# Patient Record
Sex: Female | Born: 1935 | Race: White | Hispanic: No | Smoking: Never smoker
Health system: Southern US, Community
[De-identification: ages and names within clinical notes are randomized; demographics above are authoritative.]

## PROBLEM LIST (undated history)

## (undated) DIAGNOSIS — I10 Essential (primary) hypertension: Secondary | ICD-10-CM

## (undated) DIAGNOSIS — I6350 Cerebral infarction due to unspecified occlusion or stenosis of unspecified cerebral artery: Secondary | ICD-10-CM

## (undated) DIAGNOSIS — E785 Hyperlipidemia, unspecified: Secondary | ICD-10-CM

## (undated) DIAGNOSIS — E119 Type 2 diabetes mellitus without complications: Secondary | ICD-10-CM

## (undated) DIAGNOSIS — F411 Generalized anxiety disorder: Secondary | ICD-10-CM

## (undated) DIAGNOSIS — I499 Cardiac arrhythmia, unspecified: Secondary | ICD-10-CM

## (undated) DIAGNOSIS — R002 Palpitations: Secondary | ICD-10-CM

## (undated) HISTORY — PX: BREAST ENHANCEMENT SURGERY: SHX7

## (undated) HISTORY — DX: Type 2 diabetes mellitus without complications: E11.9

## (undated) HISTORY — PX: COLONOSCOPY: SHX174

## (undated) HISTORY — DX: Palpitations: R00.2

## (undated) HISTORY — PX: WISDOM TOOTH EXTRACTION: SHX21

## (undated) HISTORY — DX: Cardiac arrhythmia, unspecified: I49.9

## (undated) HISTORY — DX: Generalized anxiety disorder: F41.1

## (undated) HISTORY — DX: Cerebral infarction due to unspecified occlusion or stenosis of unspecified cerebral artery: I63.50

## (undated) HISTORY — DX: Essential (primary) hypertension: I10

## (undated) HISTORY — PX: HEMORRHOID SURGERY: SHX153

## (undated) HISTORY — DX: Hyperlipidemia, unspecified: E78.5

---

## 1972-07-11 HISTORY — PX: ABDOMINAL HYSTERECTOMY: SHX81

## 1979-07-12 HISTORY — PX: BACK SURGERY: SHX140

## 1983-07-12 HISTORY — PX: NASAL SINUS SURGERY: SHX719

## 2000-02-29 ENCOUNTER — Other Ambulatory Visit: Admission: RE | Admit: 2000-02-29 | Discharge: 2000-02-29 | Payer: Self-pay | Admitting: Obstetrics and Gynecology

## 2000-05-04 ENCOUNTER — Encounter: Payer: Self-pay | Admitting: Obstetrics and Gynecology

## 2000-05-04 ENCOUNTER — Encounter: Admission: RE | Admit: 2000-05-04 | Discharge: 2000-05-04 | Payer: Self-pay | Admitting: Obstetrics and Gynecology

## 2000-12-05 ENCOUNTER — Ambulatory Visit (HOSPITAL_COMMUNITY): Admission: RE | Admit: 2000-12-05 | Discharge: 2000-12-05 | Payer: Self-pay | Admitting: Ophthalmology

## 2001-07-17 ENCOUNTER — Other Ambulatory Visit: Admission: RE | Admit: 2001-07-17 | Discharge: 2001-07-17 | Payer: Self-pay | Admitting: Obstetrics and Gynecology

## 2001-09-12 ENCOUNTER — Ambulatory Visit (HOSPITAL_COMMUNITY): Admission: RE | Admit: 2001-09-12 | Discharge: 2001-09-12 | Payer: Self-pay | Admitting: Family Medicine

## 2001-09-12 ENCOUNTER — Encounter: Payer: Self-pay | Admitting: Family Medicine

## 2001-09-24 ENCOUNTER — Encounter: Payer: Self-pay | Admitting: Internal Medicine

## 2001-09-24 ENCOUNTER — Ambulatory Visit (HOSPITAL_COMMUNITY): Admission: RE | Admit: 2001-09-24 | Discharge: 2001-09-24 | Payer: Self-pay | Admitting: Internal Medicine

## 2002-01-02 ENCOUNTER — Encounter: Payer: Self-pay | Admitting: Obstetrics and Gynecology

## 2002-01-02 ENCOUNTER — Encounter: Admission: RE | Admit: 2002-01-02 | Discharge: 2002-01-02 | Payer: Self-pay | Admitting: Obstetrics and Gynecology

## 2002-09-23 ENCOUNTER — Other Ambulatory Visit: Admission: RE | Admit: 2002-09-23 | Discharge: 2002-09-23 | Payer: Self-pay | Admitting: Obstetrics and Gynecology

## 2003-03-25 ENCOUNTER — Encounter: Admission: RE | Admit: 2003-03-25 | Discharge: 2003-03-25 | Payer: Self-pay | Admitting: Obstetrics and Gynecology

## 2003-03-25 ENCOUNTER — Encounter: Payer: Self-pay | Admitting: Obstetrics and Gynecology

## 2004-05-10 ENCOUNTER — Ambulatory Visit (HOSPITAL_COMMUNITY): Admission: RE | Admit: 2004-05-10 | Discharge: 2004-05-10 | Payer: Self-pay | Admitting: Family Medicine

## 2004-06-15 ENCOUNTER — Encounter: Admission: RE | Admit: 2004-06-15 | Discharge: 2004-06-15 | Payer: Self-pay | Admitting: Obstetrics and Gynecology

## 2005-01-24 ENCOUNTER — Encounter: Admission: RE | Admit: 2005-01-24 | Discharge: 2005-01-24 | Payer: Self-pay | Admitting: Obstetrics and Gynecology

## 2005-08-15 ENCOUNTER — Encounter: Admission: RE | Admit: 2005-08-15 | Discharge: 2005-08-15 | Payer: Self-pay | Admitting: Obstetrics and Gynecology

## 2006-02-14 ENCOUNTER — Emergency Department (HOSPITAL_COMMUNITY): Admission: EM | Admit: 2006-02-14 | Discharge: 2006-02-15 | Payer: Self-pay | Admitting: Emergency Medicine

## 2006-07-11 DIAGNOSIS — I6350 Cerebral infarction due to unspecified occlusion or stenosis of unspecified cerebral artery: Secondary | ICD-10-CM

## 2006-07-11 HISTORY — DX: Cerebral infarction due to unspecified occlusion or stenosis of unspecified cerebral artery: I63.50

## 2006-07-17 ENCOUNTER — Inpatient Hospital Stay (HOSPITAL_COMMUNITY): Admission: EM | Admit: 2006-07-17 | Discharge: 2006-07-19 | Payer: Self-pay | Admitting: Emergency Medicine

## 2006-10-12 ENCOUNTER — Encounter: Admission: RE | Admit: 2006-10-12 | Discharge: 2006-10-12 | Payer: Self-pay | Admitting: Obstetrics and Gynecology

## 2007-09-15 ENCOUNTER — Encounter: Payer: Self-pay | Admitting: Internal Medicine

## 2007-09-20 ENCOUNTER — Ambulatory Visit (HOSPITAL_COMMUNITY): Admission: RE | Admit: 2007-09-20 | Discharge: 2007-09-20 | Payer: Self-pay | Admitting: Family Medicine

## 2007-11-05 ENCOUNTER — Encounter: Admission: RE | Admit: 2007-11-05 | Discharge: 2007-11-05 | Payer: Self-pay | Admitting: Obstetrics and Gynecology

## 2008-02-14 ENCOUNTER — Ambulatory Visit (HOSPITAL_COMMUNITY): Admission: RE | Admit: 2008-02-14 | Discharge: 2008-02-14 | Payer: Self-pay | Admitting: Family Medicine

## 2008-05-13 ENCOUNTER — Encounter: Payer: Self-pay | Admitting: Internal Medicine

## 2008-06-02 ENCOUNTER — Encounter: Payer: Self-pay | Admitting: Internal Medicine

## 2008-09-05 ENCOUNTER — Encounter: Payer: Self-pay | Admitting: Internal Medicine

## 2008-11-07 ENCOUNTER — Ambulatory Visit: Payer: Self-pay | Admitting: Internal Medicine

## 2008-11-07 ENCOUNTER — Encounter (INDEPENDENT_AMBULATORY_CARE_PROVIDER_SITE_OTHER): Payer: Self-pay | Admitting: *Deleted

## 2008-11-17 ENCOUNTER — Telehealth: Payer: Self-pay | Admitting: Internal Medicine

## 2008-11-19 ENCOUNTER — Ambulatory Visit: Payer: Self-pay | Admitting: Internal Medicine

## 2008-11-19 LAB — HM COLONOSCOPY: HM Colonoscopy: NORMAL

## 2008-11-28 ENCOUNTER — Encounter: Admission: RE | Admit: 2008-11-28 | Discharge: 2008-11-28 | Payer: Self-pay | Admitting: Obstetrics and Gynecology

## 2009-02-04 ENCOUNTER — Encounter: Payer: Self-pay | Admitting: Internal Medicine

## 2009-06-26 ENCOUNTER — Ambulatory Visit (HOSPITAL_COMMUNITY): Admission: RE | Admit: 2009-06-26 | Discharge: 2009-06-26 | Payer: Self-pay | Admitting: Family Medicine

## 2009-07-01 ENCOUNTER — Encounter: Payer: Self-pay | Admitting: Internal Medicine

## 2009-07-01 ENCOUNTER — Ambulatory Visit (HOSPITAL_COMMUNITY): Admission: RE | Admit: 2009-07-01 | Discharge: 2009-07-01 | Payer: Self-pay | Admitting: Family Medicine

## 2009-08-04 ENCOUNTER — Ambulatory Visit: Payer: Self-pay | Admitting: Internal Medicine

## 2009-08-04 DIAGNOSIS — I6350 Cerebral infarction due to unspecified occlusion or stenosis of unspecified cerebral artery: Secondary | ICD-10-CM | POA: Insufficient documentation

## 2009-08-04 DIAGNOSIS — R002 Palpitations: Secondary | ICD-10-CM | POA: Insufficient documentation

## 2009-08-04 DIAGNOSIS — R32 Unspecified urinary incontinence: Secondary | ICD-10-CM | POA: Insufficient documentation

## 2009-08-04 DIAGNOSIS — I10 Essential (primary) hypertension: Secondary | ICD-10-CM | POA: Insufficient documentation

## 2009-08-04 DIAGNOSIS — K5730 Diverticulosis of large intestine without perforation or abscess without bleeding: Secondary | ICD-10-CM | POA: Insufficient documentation

## 2009-08-04 DIAGNOSIS — Z9189 Other specified personal risk factors, not elsewhere classified: Secondary | ICD-10-CM | POA: Insufficient documentation

## 2009-08-04 DIAGNOSIS — Z87448 Personal history of other diseases of urinary system: Secondary | ICD-10-CM | POA: Insufficient documentation

## 2009-08-04 DIAGNOSIS — I499 Cardiac arrhythmia, unspecified: Secondary | ICD-10-CM | POA: Insufficient documentation

## 2009-08-10 ENCOUNTER — Telehealth: Payer: Self-pay | Admitting: Internal Medicine

## 2009-08-10 ENCOUNTER — Ambulatory Visit: Payer: Self-pay | Admitting: Internal Medicine

## 2009-08-10 DIAGNOSIS — F411 Generalized anxiety disorder: Secondary | ICD-10-CM | POA: Insufficient documentation

## 2009-08-24 ENCOUNTER — Ambulatory Visit: Payer: Self-pay | Admitting: Internal Medicine

## 2009-09-08 ENCOUNTER — Ambulatory Visit: Payer: Self-pay | Admitting: Internal Medicine

## 2009-09-08 DIAGNOSIS — E785 Hyperlipidemia, unspecified: Secondary | ICD-10-CM | POA: Insufficient documentation

## 2009-10-02 ENCOUNTER — Ambulatory Visit: Payer: Self-pay | Admitting: Internal Medicine

## 2009-10-02 LAB — CONVERTED CEMR LAB
ALT: 18 units/L (ref 0–35)
AST: 18 units/L (ref 0–37)
Albumin: 3.8 g/dL (ref 3.5–5.2)
HDL: 41.6 mg/dL (ref >39.00)
Total Bilirubin: 0.6 mg/dL (ref 0.3–1.2)
Total Protein: 6.6 g/dL (ref 6.0–8.3)
Triglycerides: 80 mg/dL (ref 0.0–149.0)

## 2009-11-06 ENCOUNTER — Ambulatory Visit: Payer: Self-pay | Admitting: Internal Medicine

## 2009-11-06 LAB — CONVERTED CEMR LAB: Blood Glucose, Fingerstick: 145

## 2010-03-14 ENCOUNTER — Emergency Department (HOSPITAL_COMMUNITY): Admission: EM | Admit: 2010-03-14 | Discharge: 2010-03-14 | Payer: Self-pay | Admitting: Emergency Medicine

## 2010-03-26 ENCOUNTER — Telehealth: Payer: Self-pay | Admitting: Internal Medicine

## 2010-04-09 ENCOUNTER — Ambulatory Visit: Payer: Self-pay | Admitting: Internal Medicine

## 2010-04-09 ENCOUNTER — Encounter: Admission: RE | Admit: 2010-04-09 | Discharge: 2010-04-09 | Payer: Self-pay | Admitting: Internal Medicine

## 2010-04-09 ENCOUNTER — Encounter: Payer: Self-pay | Admitting: Internal Medicine

## 2010-04-09 DIAGNOSIS — R5381 Other malaise: Secondary | ICD-10-CM | POA: Insufficient documentation

## 2010-04-09 DIAGNOSIS — R5383 Other fatigue: Secondary | ICD-10-CM

## 2010-04-09 LAB — CONVERTED CEMR LAB
AST: 24 units/L (ref 0–37)
Albumin: 4.1 g/dL (ref 3.5–5.2)
Alkaline Phosphatase: 97 units/L (ref 39–117)
BUN: 18 mg/dL (ref 6–23)
Basophils Absolute: 0 10*3/uL (ref 0.0–0.1)
Bilirubin Urine: NEGATIVE
Blood in Urine, dipstick: NEGATIVE
Eosinophils Relative: 1.6 % (ref 0.0–5.0)
Glucose, Urine, Semiquant: NEGATIVE
HDL: 39.1 mg/dL (ref >39.00)
Hemoglobin: 13.2 g/dL (ref 12.0–15.0)
Lymphs Abs: 1.7 10*3/uL (ref 0.7–4.0)
MCHC: 34.3 g/dL (ref 30.0–36.0)
Monocytes Relative: 8.8 % (ref 3.0–12.0)
Neutro Abs: 4 10*3/uL (ref 1.4–7.7)
Neutrophils Relative %: 62.5 % (ref 43.0–77.0)
Platelets: 232 10*3/uL (ref 150.0–400.0)
Potassium: 4.6 meq/L (ref 3.5–5.1)
RBC: 4.21 M/uL (ref 3.87–5.11)
RDW: 13 % (ref 11.5–14.6)
Specific Gravity, Urine: <1.005
TSH: 2.51 microintl units/mL (ref 0.35–5.50)
Total CHOL/HDL Ratio: 4
Total Protein: 6.6 g/dL (ref 6.0–8.3)
Triglycerides: 100 mg/dL (ref 0.0–149.0)
VLDL: 20 mg/dL (ref 0.0–40.0)

## 2010-04-09 LAB — HM MAMMOGRAPHY: HM Mammogram: NEGATIVE

## 2010-04-21 ENCOUNTER — Ambulatory Visit: Payer: Self-pay | Admitting: Internal Medicine

## 2010-04-21 DIAGNOSIS — J209 Acute bronchitis, unspecified: Secondary | ICD-10-CM | POA: Insufficient documentation

## 2010-05-07 ENCOUNTER — Encounter: Payer: Self-pay | Admitting: Internal Medicine

## 2010-05-19 ENCOUNTER — Ambulatory Visit: Payer: Self-pay | Admitting: Internal Medicine

## 2010-05-19 DIAGNOSIS — M545 Low back pain, unspecified: Secondary | ICD-10-CM | POA: Insufficient documentation

## 2010-05-19 DIAGNOSIS — R109 Unspecified abdominal pain: Secondary | ICD-10-CM | POA: Insufficient documentation

## 2010-05-19 LAB — CONVERTED CEMR LAB
ALT: 18 units/L (ref 0–35)
AST: 16 units/L (ref 0–37)
Alkaline Phosphatase: 114 units/L (ref 39–117)
Basophils Relative: 0.4 % (ref 0.0–3.0)
Bilirubin Urine: NEGATIVE
Bilirubin, Direct: 0.1 mg/dL (ref 0.0–0.3)
Eosinophils Absolute: 0.1 10*3/uL (ref 0.0–0.7)
Hemoglobin, Urine: NEGATIVE
Leukocytes, UA: NEGATIVE
MCHC: 34.1 g/dL (ref 30.0–36.0)
MCV: 91 fL (ref 78.0–100.0)
Monocytes Absolute: 0.8 10*3/uL (ref 0.1–1.0)
Neutro Abs: 5.8 10*3/uL (ref 1.4–7.7)
Nitrite: NEGATIVE
Platelets: 236 10*3/uL (ref 150.0–400.0)
RBC: 4.21 M/uL (ref 3.87–5.11)
Specific Gravity, Urine: 1.01 (ref 1.000–1.030)
Total Bilirubin: 0.6 mg/dL (ref 0.3–1.2)
Urobilinogen, UA: 0.2 (ref 0.0–1.0)
pH: 7.5 (ref 5.0–8.0)

## 2010-06-14 ENCOUNTER — Telehealth (INDEPENDENT_AMBULATORY_CARE_PROVIDER_SITE_OTHER): Payer: Self-pay | Admitting: *Deleted

## 2010-08-01 ENCOUNTER — Encounter: Payer: Self-pay | Admitting: Obstetrics and Gynecology

## 2010-08-08 LAB — CONVERTED CEMR LAB
ALT: 15 units/L
CO2: 30 meq/L
Glucose, Bld: 96 mg/dL
HDL: 42 mg/dL
Hemoglobin: 13.1 g/dL
LDL Cholesterol: 75 mg/dL
Lymphocytes, automated: 31 %
MCV: 91.9 fL
Neutrophils Relative %: 59 %
Pap Smear: NEGATIVE
Platelets: 253 10*3/uL
Potassium: 4.4 meq/L
RBC: 4.45 M/uL
Sodium: 140 meq/L
TSH: 2.829 microintl units/mL
Total Bilirubin: 0.4 mg/dL
Triglyceride fasting, serum: 107 mg/dL

## 2010-08-10 NOTE — Assessment & Plan Note (Signed)
Summary: f/u appt/cd   Vital Signs:  Patient profile:   76 year old female Height:      66 inches (167.64 cm) Weight:      139.4 pounds (63.36 kg) BMI:     22.58 O2 Sat:      97 % on Room air Temp:     97.0 degrees F (36.11 degrees C) oral Pulse rate:   81 / minute BP sitting:   120 / 58  (left arm) Cuff size:   regular  Vitals Entered By: Orlan Leavens RMA (April 09, 2010 8:37 AM)  O2 Flow:  Room air CC: follow-up visit Is Patient Diabetic? No Pain Assessment Patient in pain? no        Primary Care Provider:  Newt Lukes MD  CC:  follow-up visit.  History of Present Illness: Patient feels well today, fasting for labs. Here for wellness Diet: Heart Healthy Physical Activities: mod active Depression/mood screen: Negative Hearing: Intact bilateral Visual Acuity: Grossly normal ADL's: Capable  Fall Risk: None Home Safety: Good Congnition eval: intact to orientation, naming, recall and repetition End-of-Life Planning: Advance directive - Full code/I agree     also review chronic med issues -  HTN - no longer taking Bbloc since seeing cards -  feels well controlled on azor, reports 100% med compliance - still feels fatigued but no CP or HA or edema or vision changes  palpitations -  saw cards for same in early 08/2009 - no longer taking Bbloc and does not feel this is making any difference in episodes -  episodes still occur 1-2x/week but not in any particular pattern or trigger  anxiety - uses xanax as needed - sleeping ok- has resumed exercise which helps reduce stress - primary concern today is FH of DM and wants her sugar checked  dylipidemia - labs reviewed from march 2011 - no problems with current meds - ?how to make HDL higher and if fish oil ok?-  very concerned re: possible  adv se and wants to check labs every 3 months to monitor  Preventive Screening-Counseling & Management  Alcohol-Tobacco     Alcohol drinks/day: 0     Alcohol Counseling:  not indicated; patient does not drink     Smoking Status: never  Caffeine-Diet-Exercise     Caffeine use/day: 1-2     Caffeine Counseling: not indicated; caffeine use is not excessive or problematic     Diet Counseling: not indicated; diet is assessed to be healthy     Does Patient Exercise: yes     Times/week: 3     Exercise Counseling: to improve exercise regimen     Depression Counseling: not indicated; screening negative for depression  Safety-Violence-Falls     Fall Risk Counseling: not indicated; no significant falls noted  Clinical Review Panels:  Prevention   Last Pap Smear:  Interpretation/Result:Negative for intraepithelial Lesion or Malignancy.    (12/09/2008)   Last Colonoscopy:  Location:  [facility].  (11/19/2008)  Immunizations   Last Flu Vaccine:  Fluvax 3+ (04/09/2010)   Last Pneumovax:  Historical (07/12/2007)  Lipid Management   Cholesterol:  152 (10/02/2009)   LDL (bad choesterol):  94 (10/02/2009)   HDL (good cholesterol):  41.60 (10/02/2009)   Triglycerides:  107 (06/04/2009)  CBC   WBC:  5.7 (06/04/2009)   RBC:  4.45 (06/04/2009)   Hgb:  13.1 (06/04/2009)   Hct:  40.9 (06/04/2009)   Platelets:  253 (06/04/2009)   MCV  91.9 (06/04/2009)  RDW  12.4 (06/04/2009)   PMN:  59 (06/04/2009)   Monos:  9 (06/04/2009)   Eosinophils:  1 (06/04/2009)   Basophil:  1 (06/04/2009)  Complete Metabolic Panel   Glucose:  96 (06/04/2009)   Sodium:  140 (06/04/2009)   Potassium:  4.4 (06/04/2009)   Chloride:  103 (06/04/2009)   CO2:  30 (06/04/2009)   BUN:  18 (06/04/2009)   Creatinine:  0.55 (06/04/2009)   Albumin:  3.8 (10/02/2009)   Total Protein:  6.6 (10/02/2009)   Calcium:  9.6 (06/04/2009)   Total Bili:  0.6 (10/02/2009)   Alk Phos:  80 (10/02/2009)   SGPT (ALT):  18 (10/02/2009)   SGOT (AST):  18 (10/02/2009)   Current Medications (verified): 1)  Azor 5-40 Mg Tabs (Amlodipine-Olmesartan) .[name].. 1 Tablet By Mouth Once Daily 2)   Aspirin 81 Mg  Tabs (Aspirin) .[name].. 1 Tablet By Mouth Once Daily 3)  Vitamin C 500 Mg Tabs (Ascorbic Acid) .... Take 2 By Mouth Qd 4)  Citracal Plus D Tabs (Multiple Minerals-Vitamins) .... Take 1 Three Times A Day 5)  Simvastatin 10 Mg Tabs (Simvastatin) .[name].. 1 By Mouth Once Daily 6)  Plavix 75 Mg Tabs (Clopidogrel Bisulfate) .[name].. 1 By Mouth Once Daily 7)  Fish Oil 1000 Mg Caps (Omega-3 Fatty Acids) .[name].. 1 By Mouth Once Daily  Allergies (verified): 1)  ! Penicillin 2)  ! Terramycin  Past History:  Past medical, surgical, family and social histories (including risk factors) reviewed, and no changes noted (except as noted below).  Past Medical History: Right Thalamic Infarction 07/2006 Hypertension dyslipidemia Diverticulosis anxiety  MD roster - gyn - richardson GI- gessner cards - taylor  Past Surgical History: Reviewed history from 08/04/2009 and no changes required. Hemorrhoidectomy (2, 1961 & 1964) Back Surgery (4-5 Lumbar inner body fusion) Hysterectomy (8119) Cholecystectomy (1981)  Family History: Reviewed history from 08/04/2009 and no changes required. Family History of Colon Cancer: Sister  Family History of Diabetes: Nephew Family History of Heart Disease: Brother Family History Lung cancer (brother, sister) Family History of Arthritis (parent, other relative) Stroke (parent, other relative)  mom expired age 25 yo!!  Social History: Reviewed history from 08/04/2009 and no changes required. Divorced, lives alone Retired Patient has never smoked.  Alcohol Use - no Daily Caffeine Use Illicit Drug Use - no  Review of Systems  The patient denies fever, weight loss, chest pain, syncope, and headaches.         fatigue (as above); otherwise, see HPI above. I have reviewed all other systems and they were negative.   Physical Exam  General:  alert, well-developed, well-nourished, and cooperative to examination.    Head:  Normocephalic and atraumatic  without obvious abnormalities. No apparent alopecia or balding. Eyes:  vision grossly intact; pupils equal, round and reactive to light.  conjunctiva and lids normal.    Ears:  normal pinnae bilaterally, without erythema, swelling, or tenderness to palpation. TMs clear, without effusion, or cerumen impaction. Hearing grossly normal bilaterally  Mouth:  teeth and gums in good repair; mucous membranes moist, without lesions or ulcers. oropharynx clear without exudate, no erythema.  Neck:  supple, full ROM, no masses, no thyromegaly; no thyroid nodules or tenderness. no JVD or carotid bruits.   Lungs:  normal respiratory effort, no intercostal retractions or use of accessory muscles; normal breath sounds bilaterally - no crackles and no wheezes.    Heart:  normal rate, regular rhythm, soft 2/6 syst murmur, and no rub. BLE without edema.  Abdomen:  soft, non-tender, normal bowel sounds, no distention; no masses and no appreciable hepatomegaly or splenomegaly.   Genitalia:  defer to gyn Msk:  No deformity or scoliosis noted of thoracic or lumbar spine.   Neurologic:  alert & oriented X3 and cranial nerves II-XII symetrically intact.  strength normal in all extremities, sensation intact to light touch, and gait normal. speech fluent without dysarthria or aphasia; follows commands with good comprehension.  Skin:  no rashes, vesicles, ulcers, or erythema. No nodules or irregularity to palpation.  Psych:  Oriented X3, memory intact for recent and remote, normally interactive, good eye contact, minimally anxious appearing, not depressed appearing, and not agitated.      Impression & Recommendations:  Problem # 1:  PREVENTIVE HEALTH CARE (ICD-V70.0)  Patient has been counseled on age-appropriate routine health concerns for screening and prevention. These are reviewed and up-to-date. Immunizations are up-to-date or declined. Labs and ECG ordered/reviewed.  Risk factors for depression per USPSTF are reviewed  and negative; Patient hearing function, visual acuity, and cognition is screened today; ADLs are reviewed and addressed as needed; functional ability and level of safety have been reviewed and are appropriate. Education, counseling, and referrals are performed as needed   Orders: TLB-Lipid Panel (80061-LIPID) TLB-BMP (Basic Metabolic Panel-BMET) (80048-METABOL) TLB-CBC Platelet - w/Differential (85025-CBCD) TLB-Hepatic/Liver Function Pnl (80076-HEPATIC) TLB-TSH (Thyroid Stimulating Hormone) (04540-JWJ) Medicare -1st Annual Wellness Visit [phone]) EKG w/ Interpretation (93000)  Problem # 2:  DYSLIPIDEMIA (ICD-272.4)  Her updated medication list for this problem includes:    Simvastatin 10 Mg Tabs (Simvastatin) .[name]... 1 by mouth once daily  check FLP and LFTs every 3 months at pt preference if labs normal, pt wishes to reduce simva by 1/2 (rx'd for CVA hx, never "bad chol" per pt)  Orders: TLB-Lipid Panel (80061-LIPID)  Problem # 3:  HYPERTENSION (ICD-401.9)  Her updated medication list for this problem includes:    Azor 5-40 Mg Tabs (Amlodipine-olmesartan) .[name]... 1 tablet by mouth once daily  BP today: 120/58 Prior BP: 124/68 (11/06/2009)  Labs Reviewed: K+: 4.4 (06/04/2009) Creat: : 0.55 (06/04/2009)   Chol: 152 (10/02/2009)   HDL: 41.60 (10/02/2009)   LDL: 94 (10/02/2009)   TG: 80.0 (10/02/2009)  Orders: EKG w/ Interpretation (93000)  Problem # 4:  ANXIETY STATE, UNSPECIFIED (ICD-300.00)  Problem # 5:  FATIGUE (ICD-780.79)  exam unchanged, follow labs  Orders: TLB-CBC Platelet - w/Differential (85025-CBCD) TLB-Hepatic/Liver Function Pnl (80076-HEPATIC) TLB-TSH (Thyroid Stimulating Hormone) (84443-TSH)  Complete Medication List: 1)  Azor 5-40 Mg Tabs (Amlodipine-olmesartan) .[name].. 1 tablet by mouth once daily 2)  Aspirin 81 Mg Tabs (Aspirin) .[name].. 1 tablet by mouth once daily 3)  Vitamin C 500 Mg Tabs (Ascorbic acid) .... Take 2 by mouth qd 4)  Citracal Plus D Tabs  (multiple Minerals-vitamins)  .... Take 1 three times a day 5)  Simvastatin 10 Mg Tabs (Simvastatin) .[name].. 1 by mouth once daily 6)  Plavix 75 Mg Tabs (Clopidogrel bisulfate) .[name].. 1 by mouth once daily 7)  Fish Oil 1000 Mg Caps (Omega-3 fatty acids) .[name].. 1 by mouth once daily  Other Orders: UA Dipstick w/o Micro (manual) (82956) Flu Vaccine 31yrs + MEDICARE PATIENTS (O1308) Administration Flu vaccine - MCR (M5784)  Patient Instructions: 1)  it was good to see you today.  2)  medications and labs reviewed today - no changes recommended today - copy of recommended screening provided 3)  test(s) ordered today - your results will be mailed to you after review in 48-72 hours from  the time of test completion; if any changes need to be made or there are abnormal results, you will be contacted directly.  4)  will consider reduction of simvastatin to 5mg  at bedtime if cholesterol well controlled 5)  Please schedule a follow-up appointment in 3 months to monitor cholesterol and liver tests, call sooner if problems.    Laboratory Results   Urine Tests    Routine Urinalysis   Color: yellow Appearance: Clear Glucose: negative   (Normal Range: Negative) Bilirubin: negative   (Normal Range: Negative) Ketone: negative   (Normal Range: Negative) Spec. Gravity: <1.005   (Normal Range: 1.003-1.035) Blood: negative   (Normal Range: Negative) pH: 5.0   (Normal Range: 5.0-8.0) Protein: negative   (Normal Range: Negative) Urobilinogen: 0.2   (Normal Range: 0-1) Nitrite: negative   (Normal Range: Negative) Leukocyte Esterace: negative   (Normal Range: Negative)      Flu Vaccine Consent Questions     Do you have a history of severe allergic reactions to this vaccine? no    Any prior history of allergic reactions to egg and/or gelatin? no    Do you have a sensitivity to the preservative Thimersol? no    Do you have a past history of Guillan-Barre Syndrome? no    Do you currently have an acute  febrile illness? no    Have you ever had a severe reaction to latex? no    Vaccine information given and explained to patient? yes    Are you currently pregnant? no    Lot Number:[id number]   Exp Date:01/08/2011   Site Given  per pt (R) gluterus IMsterace: negative   (Normal Range: Negative)      .lbmedflu

## 2010-08-10 NOTE — Assessment & Plan Note (Signed)
Summary: post er/fall/body pain/lb   Vital Signs:  Patient profile:   76 year old female Height:      66 inches (167.64 cm) Weight:      141.8 pounds (64.45 kg) O2 Sat:      99 % on Room air Temp:     98.3 degrees F (36.83 degrees C) oral Pulse rate:   93 / minute BP sitting:   132 / 62  (left arm) Cuff size:   regular  Vitals Entered By: Orlan Leavens RMA (May 19, 2010 10:58 AM)  O2 Flow:  Room air CC: Post ER Is Patient Diabetic? No Pain Assessment Patient in pain? yes     Location: body Type: aching Comments Pt fell on 05/10/10 went to ED in [place]   Primary Care Provider:  Newt Lukes MD  CC:  Post ER.  History of Present Illness: ER f/u s/p accidental fall in [place], [place] 05/07/10 trauma to right shoulder and posterior scalp (no ICH on ct head) no ha but cont right flank pain and right lower rib - concerned about internal injury or rib fx or spine fx - no hematuria, no n/v or bm changes no sob, no cough - no difficulty walking - pain improved with tylenol each am but sore by evening -  Current Medications (verified): 1)  Azor 5-40 Mg Tabs (Amlodipine-Olmesartan) .[name].. 1 Tablet By Mouth Once Daily 2)  Aspirin 81 Mg  Tabs (Aspirin) .[name].. 1 Tablet By Mouth Once Daily 3)  Vitamin C 500 Mg Tabs (Ascorbic Acid) .... Take 2 By Mouth Qd 4)  Citracal Plus D Tabs (Multiple Minerals-Vitamins) .... Take 1 Three Times A Day 5)  Simvastatin 10 Mg Tabs (Simvastatin) .... 1/2 Tab By Mouth Once Daily 6)  Plavix 75 Mg Tabs (Clopidogrel Bisulfate) .[name].. 1 By Mouth Once Daily 7)  Fish Oil 1000 Mg Caps (Omega-3 Fatty Acids) .[name].. 1 By Mouth Once Daily  Allergies (verified): 1)  ! Penicillin 2)  ! Terramycin  Past History:  Past Medical History: Right Thalamic Infarction 07/2006 Hypertension dyslipidemia  Diverticulosis anxiety     MD roster - gyn - richardson GI- gessner cards - taylor  Review of Systems  The patient denies fever, syncope,  dyspnea on exertion, melena, hematochezia, and severe indigestion/heartburn.    Physical Exam  General:  alert, well-developed, well-nourished, and cooperative to examination.  nontoxic Chest Wall:  tender over palp lower right ribs mid ax line and posterior Lungs:  normal respiratory effort, no intercostal retractions or use of accessory muscles; normal breath sounds bilaterally - no crackles and no wheezes.    Heart:  normal rate, regular rhythm, no murmur, and no rub. BLE without edema.  Skin:  no bruising over right flank Psych:  Oriented X3, memory intact for recent and remote, normally interactive, good eye contact, minimally anxious appearing, not depressed appearing, and not agitated.      Impression & Recommendations:  Problem # 1:  FLANK PAIN, RIGHT (ICD-789.09) s/p accidental fall 05/07/10 - ER dc directions reviewed - pt reports neg ct head check labs/xray now r/o other injury - encouraged to increase and sched tylenol use (500 two times a day)  also consider muscle relaxant for strain Her updated medication list for this problem includes:    Aspirin 81 Mg Tabs (Aspirin) .[name]... 1 tablet by mouth once daily  Orders: T-Ribs Unilateral 2 Views (71100TC) T-Lumbar Spine 2 Views (72100TC) TLB-Hepatic/Liver Function Pnl (80076-HEPATIC) TLB-CBC Platelet - w/Differential (85025-CBCD) TLB-Udip w/  Micro (81001-URINE)  Problem # 2:  BACK PAIN, LUMBAR (ICD-724.2) s/p accidental fall 05/07/10 - ER dc directions reviewed - pt reports neg ct head check labs/xray now r/o other injury - encouraged to increase and sched tylenol use (500 two times a day)  also consider muscle relaxant for strain Her updated medication list for this problem includes:    Aspirin 81 Mg Tabs (Aspirin) .[name]... 1 tablet by mouth once daily  Orders: T-Ribs Unilateral 2 Views (71100TC) T-Lumbar Spine 2 Views (72100TC) TLB-Hepatic/Liver Function Pnl (80076-HEPATIC) TLB-CBC Platelet - w/Differential  (85025-CBCD) TLB-Udip w/ Micro (81001-URINE)  Complete Medication List: 1)  Azor 5-40 Mg Tabs (Amlodipine-olmesartan) .[name].. 1 tablet by mouth once daily 2)  Aspirin 81 Mg Tabs (Aspirin) .[name].. 1 tablet by mouth once daily 3)  Vitamin C 500 Mg Tabs (Ascorbic acid) .... Take 2 by mouth qd 4)  Citracal Plus D Tabs (multiple Minerals-vitamins)  .... Take 1 three times a day 5)  Simvastatin 10 Mg Tabs (Simvastatin) .... 1/2 tab by mouth once daily 6)  Plavix 75 Mg Tabs (Clopidogrel bisulfate) .[name].. 1 by mouth once daily 7)  Fish Oil 1000 Mg Caps (Omega-3 fatty acids) .[name].. 1 by mouth once daily  Patient Instructions: 1)  it was good to see you today. 2)  test(s) ordered today - your results will be called to you after review 3)  use tylenol two times a day as discussed for aches for at least next 2-3 weeks as needed  4)  may consider adding muscle relaxant for pain and aches to use at bedtime if labs/xrays are ok 5)  Please schedule a follow-up appointment as needed.   Orders Added: 1)  Est. Patient Level IV [09811] 2)  T-Ribs Unilateral 2 Views [71100TC] 3)  T-Lumbar Spine 2 Views [72100TC] 4)  TLB-Hepatic/Liver Function Pnl [80076-HEPATIC] 5)  TLB-CBC Platelet - w/Differential [85025-CBCD] 6)  TLB-Udip w/ Micro [81001-URINE]

## 2010-08-10 NOTE — Progress Notes (Signed)
Summary: Preventive services  Preventive services   Imported By: Lester [place] 04/12/2010 08:27:11  _____________________________________________________________________  External Attachment:    Type:   Image     Comment:   External Document

## 2010-08-10 NOTE — Assessment & Plan Note (Signed)
Summary: new / medicare / #/ cd   Vital Signs:  Patient profile:   76 year old female Height:      66 inches (167.64 cm) Weight:      141.12 pounds (64.15 kg) O2 Sat:      94 % on Room air Temp:     98.1 degrees F (36.72 degrees C) oral Pulse rate:   92 / minute BP sitting:   142 / 64  (left arm) Cuff size:   regular  Vitals Entered By: Orlan Leavens (August 04, 2009 2:09 PM)  O2 Flow:  Room air CC: New patient Is Patient Diabetic? No Pain Assessment Patient in pain? no        Primary Care Provider:  Newt Lukes MD  CC:  New patient.  History of Present Illness: new pt to me and our division - here to est care -  1)HTN - reports compliance with ongoing medical treatment and no changes in medication dose or frequency. denies adverse side effects related to current therapy.   2) hx CVA 07/2006 - on meds for same including antiplt and statin ?if needs to be on all these meds as cholesterol always fine and taking ASA once daily - reports her CVA "was not the blood clot kind" but report not available  3) diverticulosis - concerned how dz could have progressed from mild in 2003 to severe in 2010 on colo - no abd pain - no fever or change in bowels  4) c/o palpitations onset several months ago but becoming more frequent - occurs every 2-3 days, 1-2 per day when it happens describes as a "skip" or "flutter" in her chest not a/w dizziness or CP, no syncope ?hx irregular heart beat hx- saw cards remotely for same (?2003 when returned to [place] from [place]?)  Preventive Screening-Counseling & Management  Alcohol-Tobacco     Alcohol drinks/day: 0     Alcohol Counseling: not indicated; patient does not drink     Smoking Status: never  Caffeine-Diet-Exercise     Caffeine use/day: 1-2     Caffeine Counseling: not indicated; caffeine use is not excessive or problematic     Diet Counseling: not indicated; diet is assessed to be healthy     Does Patient Exercise: yes  Times/week: 3     Exercise Counseling: to improve exercise regimen     Depression Counseling: not indicated; screening negative for depression  Safety-Violence-Falls     Fall Risk Counseling: not indicated; no significant falls noted  Clinical Review Panels:  Prevention   Last Pap Smear:  Interpretation/Result:Negative for intraepithelial Lesion or Malignancy.    (12/09/2008)   Last Colonoscopy:  Location:  [facility].  (11/19/2008)  Immunizations   Last Flu Vaccine:  Historical (04/10/2009)   Last Pneumovax:  Historical (07/12/2007)  Lipid Management   Cholesterol:  138 (06/04/2009)   LDL (bad choesterol):  75 (06/04/2009)   HDL (good cholesterol):  42 (06/04/2009)   Triglycerides:  107 (06/04/2009)  CBC   WBC:  5.7 (06/04/2009)   RBC:  4.45 (06/04/2009)   Hgb:  13.1 (06/04/2009)   Hct:  40.9 (06/04/2009)   Platelets:  253 (06/04/2009)   MCV  91.9 (06/04/2009)   RDW  12.4 (06/04/2009)   PMN:  59 (06/04/2009)   Monos:  9 (06/04/2009)   Eosinophils:  1 (06/04/2009)   Basophil:  1 (06/04/2009)  Complete Metabolic Panel   Glucose:  96 (06/04/2009)   Sodium:  140 (06/04/2009)   Potassium:  4.4 (06/04/2009)   Chloride:  103 (06/04/2009)   CO2:  30 (06/04/2009)   BUN:  18 (06/04/2009)   Creatinine:  0.55 (06/04/2009)   Albumin:  4.3 (06/04/2009)   Total Protein:  6.6 (06/04/2009)   Calcium:  9.6 (06/04/2009)   Total Bili:  0.4 (06/04/2009)   Alk Phos:  93 (06/04/2009)   SGPT (ALT):  15 (06/04/2009)   SGOT (AST):  15 (06/04/2009)   -  Date:  06/04/2009    WBC: 5.7    HGB: 13.1    HCT: 40.9    RBC: 4.45    PLT: 253    MCV: 91.9    RDW: 12.4    Neutrophil: 59    Lymphs: 31    Monos: 9    Eos: 1    Basophil: 1    BG Random: 96    BUN: 18    Creatinine: 0.55    Sodium: 140    Potassium: 4.4    Chloride: 103    CO2 Total: 30    SGOT (AST): 15    SGPT (ALT): 15    T. Bilirubin: 0.4    Alk Phos: 93    Calcium: 9.6    Total Protein:  6.6    Albumin: 4.3    Cholesterol: 138    LDL: 75    HDL: 42    Triglycerides: 161    TSH: 2.829  Current Medications (verified): 1)  Plavix 75 Mg Tabs (Clopidogrel Bisulfate) .[name].. 1 Tablet By Mouth Once Daily 2)  Azor 5-40 Mg Tabs (Amlodipine-Olmesartan) .[name].. 1 Tablet By Mouth Once Daily 3)  Simvastatin 10 Mg Tabs (Simvastatin) .[name].. 1 Tablet By Mouth Once Daily 4)  Aspirin 81 Mg  Tabs (Aspirin) .[name].. 1 Tablet By Mouth Once Daily 5)  Vitamin C 500 Mg Tabs (Ascorbic Acid) .... Take 2 By Mouth Qd 6)  Citracal Plus D Tabs (Multiple Minerals-Vitamins) .... Take 3 Once Daily  Allergies (verified): 1)  ! Penicillin 2)  ! Terramycin  Past History:  Past Medical History: Right Thalamic Infarction 07/2006 Hypertension Diverticulosis  MD rooster - gyn - [facility]  Past Surgical History: Hemorrhoidectomy (2, 1961 & 1964) Back Surgery (4-5 Lumbar inner body fusion) Hysterectomy (0960) Cholecystectomy (1981)  Family History: Reviewed history from 11/07/2008 and no changes required. Family History of Colon Cancer: Sister  Family History of Diabetes: Nephew Family History of Heart Disease: Brother Family History Lung cancer (brother, sister) Family History of Arthritis (parent, other relative) Stroke (parent, other relative)  mom expired age 73 yo!!  Social History: Reviewed history from 11/07/2008 and no changes required. Divorced, lives alone Retired Patient has never smoked.  Alcohol Use - no Daily Caffeine Use Illicit Drug Use - no Caffeine use/day:  1-2 Does Patient Exercise:  yes  Review of Systems       see HPI above. I have reviewed all other systems and they were negative.   Physical Exam  General:  alert, well-developed, well-nourished, and cooperative to examination.    Eyes:  vision grossly intact; pupils equal, round and reactive to light.  conjunctiva and lids normal.    Ears:  normal pinnae bilaterally, without erythema, swelling, or  tenderness to palpation. TMs clear, without effusion, or cerumen impaction. Hearing grossly normal bilaterally  Mouth:  teeth and gums in good repair; mucous membranes moist, without lesions or ulcers. oropharynx clear without exudate, no erythema.  Neck:  supple, full ROM, no masses, no thyromegaly; no thyroid nodules or tenderness.  no JVD or carotid bruits.   Lungs:  normal respiratory effort, no intercostal retractions or use of accessory muscles; normal breath sounds bilaterally - no crackles and no wheezes.    Heart:  normal rate, regular rhythm, soft 2/6 syst murmur, and no rub. BLE without edema. occ pause appreciated (<1 sec every 20-30 sec) Abdomen:  soft, non-tender, normal bowel sounds, no distention; no masses and no appreciable hepatomegaly or splenomegaly.   Msk:  No deformity or scoliosis noted of thoracic or lumbar spine.   Neurologic:  alert & oriented X3 and cranial nerves II-XII symetrically intact.  strength normal in all extremities, sensation intact to light touch, and gait normal. speech fluent without dysarthria or aphasia; follows commands with good comprehension.  Psych:  Oriented X3, memory intact for recent and remote, normally interactive, good eye contact, minimally anxious appearing, not depressed appearing, and not agitated.      Impression & Recommendations:  Problem # 1:  HYPERTENSION (ICD-401.9)  this appears to be pt's greatest risk factor for recurrent CVA - has been well controled on current meds - will not change at this time but consider ionotropic agent such as Bbloc if palp persist - see below Her updated medication list for this problem includes:    Azor 5-40 Mg Tabs (Amlodipine-olmesartan) .[name]... 1 tablet by mouth once daily  BP today: 142/64 Prior BP: 126/58 (11/07/2008)  Labs Reviewed: K+: 4.4 (06/04/2009) Creat: : 0.55 (06/04/2009)   Chol: 138 (06/04/2009)   HDL: 42 (06/04/2009)   LDL: 75 (06/04/2009)   TG: 107 (06/04/2009)  Orders: Cardiology  Referral (Cardiology) EKG w/ Interpretation (93000)  Problem # 2:  CVA (ICD-434.91) hx same 07/2006 - not atherscleoritc per pt understanding - will get report and review prior records in further detail does not need dual antiplt so will stop plavix at this time also explained medical process of treating all risk factors The following medications were removed from the medication list:    Plavix 75 Mg Tabs (Clopidogrel bisulfate) .[name]... 1 tablet by mouth once daily Her updated medication list for this problem includes:    Aspirin 81 Mg Tabs (Aspirin) .[name]... 1 tablet by mouth once daily  Problem # 3:  PALPITATIONS (ICD-785.1)  ?? if symptoms from skipped beat - EKG today without evidence of arrythmia - recent labs show no anemia, normal TSH - will refer to cards for eval of same as pt concerned about these progressive occurence of symptoms   Avoid caffeine, chocolate, and stimulants. Stress reduction as well as medication options discussed.   Orders: Cardiology Referral (Cardiology) EKG w/ Interpretation (93000)  Problem # 4:  other  Time spent with patient 45 minutes, more than 50% of this time was spent counseling patient on risk factors for stroke and the medical mgmt of these including risk and benefit of less medication - will stop plavix and statin at this time after this discussion - pt understands and agrees  Complete Medication List: 1)  Azor 5-40 Mg Tabs (Amlodipine-olmesartan) .[name].. 1 tablet by mouth once daily 2)  Aspirin 81 Mg Tabs (Aspirin) .[name].. 1 tablet by mouth once daily 3)  Vitamin C 500 Mg Tabs (Ascorbic acid) .... Take 2 by mouth qd 4)  Citracal Plus D Tabs (multiple Minerals-vitamins)  .... Take 3 once daily  Patient Instructions: 1)  it was good to see you today.  2)  labs and tests from other providers have been reviewed today 3)  stop the plavix but continue aspirin 81mg  once  daily  4)  stop the simvastatin because your cholesterol is good -  5)  we will  recheck your cholesterol in 12 months to reevaluate if simvastatin needed in the future 6)  continue to exercise and increase your activity as tolerated- 7)  we'll make referral to [facility] cardiology for your palpitations. Our office will contact you regarding this appointment once made.  8)  Please schedule a follow-up appointment in 3-6 months to recheck blood pressure, call sooner if problems.    Pap Smear  Procedure date:  12/09/2008  Findings:      Interpretation/Result:Negative for intraepithelial Lesion or Malignancy.     Bone Density  Procedure date:  07/01/2009  Findings:      done @ [facility], Dr. Karleen Hampshire Femur neck left is 0.840g/cm with a T-score of -1.4. this patient is considered osteopenic    Immunization History:  Influenza Immunization History:    Influenza:  historical (04/10/2009)  Pneumovax Immunization History:    Pneumovax:  historical (07/12/2007)

## 2010-08-10 NOTE — Letter (Signed)
Summary: Screening results  Screening results   Imported By: Lester Edgerton 08/07/2009 14:32:06  _____________________________________________________________________  External Attachment:    Type:   Image     Comment:   External Document

## 2010-08-10 NOTE — Assessment & Plan Note (Signed)
Summary: ?cold/?bronchitis per pt/lb   Vital Signs:  Patient profile:   76 year old female Height:      66 inches (167.64 cm) Weight:      139 pounds (63.18 kg) O2 Sat:      97 % on Room air Temp:     99.1 degrees F (37.28 degrees C) oral Pulse rate:   97 / minute BP sitting:   130 / 60  (left arm) Cuff size:   regular  Vitals Entered By: Orlan Leavens RMA (April 21, 2010 4:26 PM)  O2 Flow:  Room air CC: ? Cold or bronchitis, URI symptoms Is Patient Diabetic? No Pain Assessment Patient in pain? no        Primary Care Provider:  Newt Lukes MD  CC:  ? Cold or bronchitis and URI symptoms.  History of Present Illness:  URI Symptoms      This is a 76 year old woman who presents with URI symptoms.  The symptoms began 4 days ago.  The severity is described as moderate.  The patient reports nasal congestion, clear nasal discharge, sore throat, productive cough, and sick contacts, but denies dry cough and earache.  Associated symptoms include low-grade fever (<100.5 degrees).  The patient denies dyspnea, wheezing, vomiting, and use of an antipyretic.  The patient also reports sneezing, headache, and muscle aches.  The patient denies seasonal symptoms and response to antihistamine.  The patient denies the following risk factors for Strep sinusitis: tooth pain, Strep exposure, tender adenopathy, and absence of cough.    Current Medications (verified): 1)  Azor 5-40 Mg Tabs (Amlodipine-Olmesartan) .[name].. 1 Tablet By Mouth Once Daily 2)  Aspirin 81 Mg  Tabs (Aspirin) .[name].. 1 Tablet By Mouth Once Daily 3)  Vitamin C 500 Mg Tabs (Ascorbic Acid) .... Take 2 By Mouth Qd 4)  Citracal Plus D Tabs (Multiple Minerals-Vitamins) .... Take 1 Three Times A Day 5)  Simvastatin 10 Mg Tabs (Simvastatin) .... 1/2 Tab By Mouth Once Daily 6)  Plavix 75 Mg Tabs (Clopidogrel Bisulfate) .[name].. 1 By Mouth Once Daily 7)  Fish Oil 1000 Mg Caps (Omega-3 Fatty Acids) .[name].. 1 By Mouth Once Daily  Allergies  (verified): 1)  ! Penicillin 2)  ! Terramycin  Past History:  Past Medical History: Right Thalamic Infarction 07/2006 Hypertension dyslipidemia  Diverticulosis anxiety    MD roster - gyn - [facility] cards - taylor  Review of Systems  The patient denies anorexia, chest pain, syncope, hemoptysis, and abdominal pain.    Physical Exam  General:  alert, well-developed, well-nourished, and cooperative to examination.   mod ill appearing Neck:  supple, full ROM, no masses, no thyromegaly; no thyroid nodules or tenderness. no JVD or carotid bruits.   Lungs:  few rhonchit bilaterally - good air mvmt w/o wheeze Heart:  normal rate, regular rhythm, no murmur, and no rub. BLE without edema.   Impression & Recommendations:  Problem # 1:  ACUTE BRONCHITIS (ICD-466.0)  Her updated medication list for this problem includes:    Azithromycin 250 Mg Tabs (Azithromycin) .[name]... 2 tabs by mouth today, then 1 by mouth daily starting tomorrow    Benzonatate 100 Mg Caps (Benzonatate) .[name]... 1 by mouth three times a day as needed for cough  Take antibiotics and other medications as directed. Encouraged to push clear liquids, get enough rest, and take acetaminophen as needed. To be seen in 5-7 days if no improvement, sooner if worse.  Orders: Prescription Created Electronically [phone])  Complete Medication List: 1)  Azor 5-40 Mg Tabs (Amlodipine-olmesartan) .[name].. 1 tablet by mouth once daily 2)  Aspirin 81 Mg Tabs (Aspirin) .[name].. 1 tablet by mouth once daily 3)  Vitamin C 500 Mg Tabs (Ascorbic acid) .... Take 2 by mouth qd 4)  Citracal Plus D Tabs (multiple Minerals-vitamins)  .... Take 1 three times a day 5)  Simvastatin 10 Mg Tabs (Simvastatin) .... 1/2 tab by mouth once daily 6)  Plavix 75 Mg Tabs (Clopidogrel bisulfate) .[name].. 1 by mouth once daily 7)  Fish Oil 1000 Mg Caps (Omega-3 fatty acids) .[name].. 1 by mouth once daily 8)  Azithromycin 250 Mg Tabs (Azithromycin) .... 2 tabs by  mouth today, then 1 by mouth daily starting tomorrow 9)  Benzonatate 100 Mg Caps (Benzonatate) .[name].. 1 by mouth three times a day as needed for cough  Patient Instructions: 1)  it was good to see you today. 2)  azithromycin antibiotics and tessalon for cough to help bronchitis symptoms - your prescriptions have been electronically submitted to your pharmacy. Please take as directed. Contact our office if you believe you're having problems with the medication(s).  3)  ok to also use Robitussin and/or Benadryl fopr cough and runny nose symtpmos as needed 4)  Get plenty of rest, drink lots of clear liquids, and use Tylenol or Ibuprofen for fever and comfort. Return in 7-10 days if you're not better:sooner if you're feeling worse. Prescriptions: BENZONATATE 100 MG CAPS (BENZONATATE) 1 by mouth three times a day as needed for cough  #30 x 0   Entered and Authorized by:   Newt Lukes MD   Signed by:   Newt Lukes MD on 04/21/2010   Method used:   Electronically to        [facility] [phone]* (retail)       [address]       [place], [place]  [address]       Ph: [phone] or [phone]       Fax: [phone]   RxID:   [id number] AZITHROMYCIN 250 MG TABS (AZITHROMYCIN) 2 tabs by mouth today, then 1 by mouth daily starting tomorrow  #6 x 0   Entered and Authorized by:   Newt Lukes MD   Signed by:   Newt Lukes MD on 04/21/2010   Method used:   Electronically to        [facility] [phone]* (retail)       [address]       [place], [place]  [address]       Ph: [phone] or [phone]       Fax: [phone]   RxID:   [id number]

## 2010-08-10 NOTE — Progress Notes (Signed)
Summary: UTI sxs  Phone Note Call from Patient Call back at Home Phone [phone] Call back at [phone]   Caller: Patient Summary of Call: Pt states that she had a UTI over Labor Day weekend and went to ER for treatment. She states that she is having the same symptoms again and would like an RX sent in to her pharmacy-please advise Initial call taken by: Brenton Grills MA,  March 26, 2010 4:25 PM  Follow-up for Phone Call        ok - erx cipro done - advise to be seen in Sat clinic here at [facility] tomorrow am if symptoms worse despite cipro- thanks Follow-up by: Newt Lukes MD,  March 26, 2010 5:28 PM  Additional Follow-up for Phone Call Additional follow up Details #1::        Pt informed Additional Follow-up by: Margaret Pyle, CMA,  March 29, 2010 10:41 AM    Prescriptions: CIPROFLOXACIN HCL 500 MG TABS (CIPROFLOXACIN HCL) 1 by mouth two times a day x 3 days  #6 x 0   Entered and Authorized by:   Newt Lukes MD   Signed by:   Newt Lukes MD on 03/26/2010   Method used:   Electronically to        [facility] [phone]* (retail)       [address]       [place], [place]  [address]       Ph: [phone] or [phone]       Fax: [phone]   RxID:   [id number]

## 2010-08-10 NOTE — Letter (Signed)
Summary: Instructions/[facility]  Instructions/[facility]   Imported By: Sherian Rein 06/18/2010 09:56:13  _____________________________________________________________________  External Attachment:    Type:   Image     Comment:   External Document

## 2010-08-10 NOTE — Assessment & Plan Note (Signed)
Summary: ELEV BP /NWS   Vital Signs:  Patient profile:   76 year old female Height:      66 inches (167.64 cm) Weight:      139.4 pounds (63.36 kg) BMI:     22.58 O2 Sat:      97 % on Room air Temp:     98.0 degrees F (36.67 degrees C) oral Pulse rate:   90 / minute BP sitting:   152 / 68  (left arm) Cuff size:   regular  Vitals Entered By: Orlan Leavens (August 10, 2009 3:34 PM)  O2 Flow:  Room air CC: Elevated bp Is Patient Diabetic? No Pain Assessment Patient in pain? no        Primary Care Provider:  Newt Lukes MD  CC:  Elevated bp.  History of Present Illness: here with concern for elevated BP readings -  5 days ago felt onset of head pressure c/w her symptoms when BP runs high - home BP log reviewed - SBP140-180  checking BP >10x/day in last 5 days no CP or vision changes concerned about risk of stopping plavix and simvastatin so has not stopped yet -  +a/w anxiety and "nervous feeling" - but denies depression -  "my sister and brother both died last year and i am living all alone"... agrees BP is worse when she gets nervous and ?about medication for ther nerves  Current Medications (verified): 1)  Azor 5-40 Mg Tabs (Amlodipine-Olmesartan) .[name].. 1 Tablet By Mouth Once Daily 2)  Aspirin 81 Mg  Tabs (Aspirin) .[name].. 1 Tablet By Mouth Once Daily 3)  Vitamin C 500 Mg Tabs (Ascorbic Acid) .... Take 2 By Mouth Qd 4)  Citracal Plus D Tabs (Multiple Minerals-Vitamins) .... Take 3 Three Times A Day  Allergies (verified): 1)  ! Penicillin 2)  ! Terramycin  Past History:  Past Medical History: Last updated: 08/04/2009 Right Thalamic Infarction 07/2006 Hypertension Diverticulosis  MD rooster - [facility] - [facility]  Review of Systems  The patient denies vision loss, chest pain, syncope, and abdominal pain.    Physical Exam  General:  alert, well-developed, well-nourished, and cooperative to examination.    Lungs:  normal respiratory effort,  no intercostal retractions or use of accessory muscles; normal breath sounds bilaterally - no crackles and no wheezes.    Heart:  normal rate, regular rhythm, soft 2/6 syst murmur, and no rub. BLE without edema. occ pause appreciated Psych:  Oriented X3, memory intact for recent and remote, normally interactive, good eye contact, minimally anxious appearing, not depressed appearing, and not agitated.      Impression & Recommendations:  Problem # 1:  HYPERTENSION (ICD-401.9)  home BP are elevated - unlikely related to anxiety alone - given +palp and anxious symptoms will add Bbloc to current antiHTN for effort at improved control of BP - d/w pt who agrees -  f/u with cards on same - sched for 2/14 Her updated medication list for this problem includes:    Azor 5-40 Mg Tabs (Amlodipine-olmesartan) .[name]... 1 tablet by mouth once daily    Metoprolol Tartrate 50 Mg Tabs (Metoprolol tartrate) .[name]... 1 by mouth two times a day  BP today: 152/68 Prior BP: 142/64 (08/04/2009)  Labs Reviewed: K+: 4.4 (06/04/2009) Creat: : 0.55 (06/04/2009)   Chol: 138 (06/04/2009)   HDL: 42 (06/04/2009)   LDL: 75 (06/04/2009)   TG: 107 (06/04/2009)  Orders: Prescription Created Electronically [phone])  Problem # 2:  ANXIETY STATE, UNSPECIFIED (ICD-300.00) ?  trigger or effect opf elev BP as above -  +social stressors with recent family deaths and illness of elderly aunt whom she helps provide care for -- explained affect of Bbloc on helping to block the symptoms but alos ok to use low dose as needed BZ as needed - reminded pt xanax is not primarily for use to reduce BP (as she was lead to believe by d/w nurse friends re: her symptoms and their recs to get a nerve pill for her BP) considerbehav health refer for further emotional support ok to cont Plavix and statin for now as this may have exac by fears -- see next Her updated medication list for this problem includes:    Alprazolam 0.5 Mg Tabs (Alprazolam) .[name]... 1  by mouth every 12h as needed for anxiety  Problem # 3:  CVA (ICD-434.91)  hx same 07/2006 - i still feel pt does not need dual antiplt and would rec to stop plavix -  but ok to cont this and low dose statin until further eval and recs by cards given inc in pt anxiety when we dc'd these meds last week no adv SE noted from Pavix or statin  Her updated medication list for this problem includes:    Aspirin 81 Mg Tabs (Aspirin) .[name]... 1 tablet by mouth once daily    Plavix 75 Mg Tabs (Clopidogrel bisulfate) .[name]... 1 by mouth once daily  Complete Medication List: 1)  Azor 5-40 Mg Tabs (Amlodipine-olmesartan) .[name].. 1 tablet by mouth once daily 2)  Aspirin 81 Mg Tabs (Aspirin) .[name].. 1 tablet by mouth once daily 3)  Vitamin C 500 Mg Tabs (Ascorbic acid) .... Take 2 by mouth qd 4)  Citracal Plus D Tabs (multiple Minerals-vitamins)  .... Take 3 three times a day 5)  Simvastatin 10 Mg Tabs (Simvastatin) .[name].. 1 by mouth once daily 6)  Plavix 75 Mg Tabs (Clopidogrel bisulfate) .[name].. 1 by mouth once daily 7)  Metoprolol Tartrate 50 Mg Tabs (Metoprolol tartrate) .[name].. 1 by mouth two times a day 8)  Alprazolam 0.5 Mg Tabs (Alprazolam) .[name].. 1 by mouth every 12h as needed for anxiety  Patient Instructions: 1)  it was good to see you today.  2)  continue the simvastatin and plavix until you meet with cardiology for another opinion on these medications 3)  start new medication for blood pressure as discussed - lopressor -  4)  also xanax as needed for anxiety symptoms if lopressor not helping to control your symptoms  5)  Please schedule a follow-up appointment in 4-6 weeks to review, sooner if problems.  Prescriptions: ALPRAZOLAM 0.5 MG TABS (ALPRAZOLAM) 1 by mouth every 12h as needed for anxiety  #30 x 1   Entered and Authorized by:   Newt Lukes MD   Signed by:   Newt Lukes MD on 08/10/2009   Method used:   Print then Give to Patient   RxID:   [id number] METOPROLOL TARTRATE 50 MG TABS  (METOPROLOL TARTRATE) 1 by mouth two times a day  #60 x 2   Entered and Authorized by:   Newt Lukes MD   Signed by:   Newt Lukes MD on 08/10/2009   Method used:   Electronically to        [facility] [id number]* (retail)       [address]       [place], [place]  [address]  Ph: [phone] or [phone]       Fax: [phone]   RxID:   [id number]

## 2010-08-10 NOTE — Assessment & Plan Note (Signed)
Summary: nep/palps   Visit Type:  Initial Consult Primary Provider:  Newt Lukes MD   History of Present Illness: Kristen Rivas is referred today by Dr. Felicity Coyer for evaluation of palpitations.  The patient has a h/o stroke in 2008 with little residual deficit.  She has had HTN and dyslipidemia.  She has had longstanding palpitations with documented PVC's.  Her current problem began several months ago when she would feel like her heart would beat rapidly.  This would last up to a minute or two, never longer, and was associated with dizziness but no syncope.  Associated with these episodes was elevation of the blood pressure.  She was initially treated with a beta blocker but only took two doses before she thought it was irritating her stomach.  She notes that the episode are fairly infrequent and at times she will go over a month between the two.  No other complaints today except that she would like to take less medication.  Current Medications (verified): 1)  Azor 5-40 Mg Tabs (Amlodipine-Olmesartan) .[name].. 1 Tablet By Mouth Once Daily 2)  Aspirin 81 Mg  Tabs (Aspirin) .[name].. 1 Tablet By Mouth Once Daily 3)  Vitamin C 500 Mg Tabs (Ascorbic Acid) .... Take 2 By Mouth Qd 4)  Citracal Plus D Tabs (Multiple Minerals-Vitamins) .... Take 3 Three Times A Day 5)  Simvastatin 10 Mg Tabs (Simvastatin) .[name].. 1 By Mouth Once Daily 6)  Plavix 75 Mg Tabs (Clopidogrel Bisulfate) .[name].. 1 By Mouth Once Daily 7)  Metoprolol Tartrate 50 Mg Tabs (Metoprolol Tartrate) .[name].. 1 By Mouth Once Daily  Allergies: 1)  ! Penicillin 2)  ! Terramycin  Past History:  Past Medical History: Last updated: 08/04/2009 Right Thalamic Infarction 07/2006 Hypertension Diverticulosis  MD rooster - [facility]  Past Surgical History: Last updated: 08/04/2009 Hemorrhoidectomy (2, 1961 & 1964) Back Surgery (4-5 Lumbar inner body fusion) Hysterectomy (1974) Cholecystectomy (1981)  Family History: Last  updated: 08/04/2009 Family History of Colon Cancer: Sister  Family History of Diabetes: Nephew Family History of Heart Disease: Brother Family History Lung cancer (brother, sister) Family History of Arthritis (parent, other relative) Stroke (parent, other relative)  mom expired age 36 yo!!  Social History: Last updated: 08/04/2009 Divorced, lives alone Retired Patient has never smoked.  Alcohol Use - no Daily Caffeine Use Illicit Drug Use - no  Review of Systems       all systems reviewed and negative except as noted in the HPI.  Vital Signs:  Patient profile:   76 year old female Height:      66 inches Weight:      140 pounds BMI:     22.68 Pulse rate:   68 / minute BP sitting:   130 / 76  (left arm)  Vitals Entered By: Laurance Flatten CMA (August 24, 2009 11:05 AM)  Physical Exam  General:  alert, well-developed, well-nourished, and cooperative to examination.    Head:  normocephalic and atraumatic Eyes:  vision grossly intact; pupils equal, round and reactive to light.  conjunctiva and lids normal.    Mouth:  teeth and gums in good repair; mucous membranes moist, without lesions or ulcers. oropharynx clear without exudate, no erythema.  Neck:  supple, full ROM, no masses, no thyromegaly; no thyroid nodules or tenderness. no JVD or carotid bruits.   Lungs:  normal respiratory effort, no intercostal retractions or use of accessory muscles; normal breath sounds bilaterally - no crackles and no wheezes.  Heart:  normal rate, regular rhythm, soft 2/6 syst murmur, and no rub. BLE without edema. occ pause appreciated Abdomen:  soft, non-tender, normal bowel sounds, no distention; no masses and no appreciable hepatomegaly or splenomegaly.   Msk:  No deformity or scoliosis noted of thoracic or lumbar spine.   Pulses:  pulses normal in all 4 extremities Extremities:  No clubbing or cyanosis. Neurologic:  Alert and oriented x 3.   EKG  Procedure date:   08/04/2009  Findings:      Normal sinus rhythm with rate of: 88.  Frequent PVC's.   Impression & Recommendations:  Problem # 1:  PALPITATIONS (ICD-785.1) The patient clearly has PVC's but also has symptoms that sound like she is having brief episodes of atrial fibrillation.  We have no documentation of this and I have asked her to call us if and when her symptoms become more severe.  Her updated medication list for this problem includes:    Azor 5-40 Mg Tabs (Amlodipine-olmesartan) .[name]... 1 tablet by mouth once daily    Aspirin 81 Mg Tabs (Aspirin) .[name]... 1 tablet by mouth once daily    Plavix 75 Mg Tabs (Clopidogrel bisulfate) .[name]... 1 by mouth once daily    Metoprolol Tartrate 50 Mg Tabs (Metoprolol tartrate) .[name]... 1 by mouth once daily  Problem # 2:  HYPERTENSION (ICD-401.9) Her blood pressure appears to be well controlled.  I have asked her to keep track of any fluctuations.  A low sodium diet is recommended. Her updated medication list for this problem includes:    Azor 5-40 Mg Tabs (Amlodipine-olmesartan) .[name]... 1 tablet by mouth once daily    Aspirin 81 Mg Tabs (Aspirin) .[name]... 1 tablet by mouth once daily    Metoprolol Tartrate 50 Mg Tabs (Metoprolol tartrate) .[name]... 1 by mouth once daily  Problem # 3:  CEREBROVASCULAR DISEASE, HX OF, ON PLAVIX (ICD-V12.50) At this time I would not recommend stopping plavix.  Patient Instructions: 1)  Your physician recommends that you schedule a follow-up appointment in: 6 months with Dr Ladona Ridgel

## 2010-08-10 NOTE — Assessment & Plan Note (Signed)
Summary: 4-6 WK ROV /NWS  #   Vital Signs:  Patient profile:   76 year old female Height:      66 inches (167.64 cm) Weight:      138.12 pounds (62.78 kg) O2 Sat:      95 % Temp:     97.1 degrees F (36.17 degrees C) oral Pulse rate:   101 / minute BP sitting:   134 / 68  (left arm) Cuff size:   regular  Vitals Entered By: Orlan Leavens (September 08, 2009 1:09 PM) CC: 4 week follow-up/ pt want to discuss rx metoprolol Is Patient Diabetic? No Pain Assessment Patient in pain? no        Primary Care Provider:  Newt Lukes MD  CC:  4 week follow-up/ pt want to discuss rx metoprolol.  History of Present Illness: here for follow up -  HTN - no longer taking Bbloc since seeing cards -  feels well controlled on azor  palpitations - saw cards for same in early 08/2009 - no longer taking Bbloc and does not feel itis making any difference in episodes -   anxiety - uses xanax as needed - sleeping ok- has resumed exercise which helps reduce stress  dylipidemia - due for f/u labs at end of march - no problems with current meds - ?how to make HDL higher and if fish oil ok?-  Clinical Review Panels:  Lipid Management   Cholesterol:  138 (06/04/2009)   LDL (bad choesterol):  75 (06/04/2009)   HDL (good cholesterol):  42 (06/04/2009)   Triglycerides:  107 (06/04/2009)  CBC   WBC:  5.7 (06/04/2009)   RBC:  4.45 (06/04/2009)   Hgb:  13.1 (06/04/2009)   Hct:  40.9 (06/04/2009)   Platelets:  253 (06/04/2009)   MCV  91.9 (06/04/2009)   RDW  12.4 (06/04/2009)   PMN:  59 (06/04/2009)   Monos:  9 (06/04/2009)   Eosinophils:  1 (06/04/2009)   Basophil:  1 (06/04/2009)  Complete Metabolic Panel   Glucose:  96 (06/04/2009)   Sodium:  140 (06/04/2009)   Potassium:  4.4 (06/04/2009)   Chloride:  103 (06/04/2009)   CO2:  30 (06/04/2009)   BUN:  18 (06/04/2009)   Creatinine:  0.55 (06/04/2009)   Albumin:  4.3 (06/04/2009)   Total Protein:  6.6 (06/04/2009)   Calcium:  9.6  (06/04/2009)   Total Bili:  0.4 (06/04/2009)   Alk Phos:  93 (06/04/2009)   SGPT (ALT):  15 (06/04/2009)   SGOT (AST):  15 (06/04/2009)   Current Medications (verified): 1)  Azor 5-40 Mg Tabs (Amlodipine-Olmesartan) .[name].. 1 Tablet By Mouth Once Daily 2)  Aspirin 81 Mg  Tabs (Aspirin) .[name].. 1 Tablet By Mouth Once Daily 3)  Vitamin C 500 Mg Tabs (Ascorbic Acid) .... Take 2 By Mouth Qd 4)  Citracal Plus D Tabs (Multiple Minerals-Vitamins) .... Take 1 Three Times A Day 5)  Simvastatin 10 Mg Tabs (Simvastatin) .[name].. 1 By Mouth Once Daily 6)  Plavix 75 Mg Tabs (Clopidogrel Bisulfate) .[name].. 1 By Mouth Once Daily 7)  Metoprolol Tartrate 50 Mg Tabs (Metoprolol Tartrate) .[name].. 1 By Mouth Once Daily  Allergies (verified): 1)  ! Penicillin 2)  ! Terramycin  Past History:  Past Medical History: Right Thalamic Infarction 07/2006 Hypertension dyslipidemia Diverticulosis  MD rooster - g[facility] - [facility]  Review of Systems  The patient denies anorexia, weight loss, chest pain, dyspnea on exertion, and peripheral edema.  Physical Exam  General:  alert, well-developed, well-nourished, and cooperative to examination.    Lungs:  normal respiratory effort, no intercostal retractions or use of accessory muscles; normal breath sounds bilaterally - no crackles and no wheezes.    Heart:  normal rate, regular rhythm, soft 2/6 syst murmur, and no rub. BLE without edema. occ pause appreciated Psych:  Oriented X3, memory intact for recent and remote, normally interactive, good eye contact, minimally anxious appearing, not depressed appearing, and not agitated.      Impression & Recommendations:  Problem # 1:  HYPERTENSION (ICD-401.9)  The following medications were removed from the medication list:    Metoprolol Tartrate 50 Mg Tabs (Metoprolol tartrate) .[name]... 1 by mouth once daily Her updated medication list for this problem includes:    Azor 5-40 Mg Tabs (Amlodipine-olmesartan) .[name]... 1  tablet by mouth once daily  BP today: 134/68 Prior BP: 130/76 (08/24/2009)  Labs Reviewed: K+: 4.4 (06/04/2009) Creat: : 0.55 (06/04/2009)   Chol: 138 (06/04/2009)   HDL: 42 (06/04/2009)   LDL: 75 (06/04/2009)   TG: 107 (06/04/2009)  Problem # 2:  ANXIETY STATE, UNSPECIFIED (ICD-300.00)  Discussed medication use and relaxation techniques.   Problem # 3:  DYSLIPIDEMIA (ICD-272.4) ok to add omega 3/fish oil - plan recheck of lipids and LFTs last week in march per pt (every 3-6 mos) Her updated medication list for this problem includes:    Simvastatin 10 Mg Tabs (Simvastatin) .[name]... 1 by mouth once daily  Labs Reviewed: SGOT: 15 (06/04/2009)   SGPT: 15 (06/04/2009)   HDL:42 (06/04/2009)  LDL:75 (06/04/2009)  Chol:138 (06/04/2009)  Trig:107 (06/04/2009)  Problem # 4:  PALPITATIONS (ICD-785.1)  The following medications were removed from the medication list:    Metoprolol Tartrate 50 Mg Tabs (Metoprolol tartrate) .[name]... 1 by mouth once daily  per cards note from 08/24/09: The patient clearly has PVC's but also has symptoms that sound like she is having brief episodes of atrial fibrillation.  We have no documentation of this and I have asked her to call us if and when her symptoms become more severe.   however feels well off Bbloc, so removed from med list as pt is not taking if symptoms worse again, she will contact us or cards for further eval - reassurance provided  Complete Medication List: 1)  Azor 5-40 Mg Tabs (Amlodipine-olmesartan) .[name].. 1 tablet by mouth once daily 2)  Aspirin 81 Mg Tabs (Aspirin) .[name].. 1 tablet by mouth once daily 3)  Vitamin C 500 Mg Tabs (Ascorbic acid) .... Take 2 by mouth qd 4)  Citracal Plus D Tabs (multiple Minerals-vitamins)  .... Take 1 three times a day 5)  Simvastatin 10 Mg Tabs (Simvastatin) .[name].. 1 by mouth once daily 6)  Plavix 75 Mg Tabs (Clopidogrel bisulfate) .[name].. 1 by mouth once daily 7)  Fish Oil 1000 Mg Caps (Omega-3 fatty acids) .[name].. 1 by  mouth once daily  Patient Instructions: 1)  it was good to see you today.  2)  medications updated -stop for labs only last week in March as discussed (lipids - 272.4, LFTs -v58.69) - your results will be posted on the phone tree for review in 48-72 hours from the time of test completion; call [phone] and enter your 9 digit MRN (listed above on this page, just below your name); if any changes need to be made or there are abnormal results, you will be contacted directly.  3)  Please schedule a follow-up appointment in end of june  or july, sooner if problems.

## 2010-08-10 NOTE — Assessment & Plan Note (Signed)
Summary: 3 mos f/u // #/ cd   Vital Signs:  Patient profile:   76 year old female Height:      66 inches (167.64 cm) Weight:      140.0 pounds (63.64 kg) O2 Sat:      96 % on Room air Temp:     98.1 degrees F (36.72 degrees C) oral Pulse rate:   99 / minute BP sitting:   124 / 68  (left arm) Cuff size:   regular  Vitals Entered By: Orlan Leavens (November 06, 2009 2:32 PM)  O2 Flow:  Room air CC: 3 month follow-up/ Req 90 supply on meds send to [facility] Is Patient Diabetic? No Pain Assessment Patient in pain? no      CBG Result 145   Primary Care Provider:  Newt Lukes MD  CC:  3 month follow-up/ Req 90 supply on meds send to [facility].  History of Present Illness: here for follow up -  HTN - no longer taking Bbloc since seeing cards -  feels well controlled on azor, reports 100% med compliance - feels fatigued but no CP or HA or edema or vision changes  palpitations -  saw cards for same in early 08/2009 - no longer taking Bbloc and does not feel this is making any difference in episodes -  episodes still occur 1-2x/week but not in any particular pattern or trigger  anxiety - uses xanax as needed - sleeping ok- has resumed exercise which helps reduce stress - primary concern today is FH of DM and wants her sugar checked  dylipidemia - labs reviewed from march 2011 - no problems with current meds - ?how to make HDL higher and if fish oil ok?-  very concerned re: possible  adv se and wants to check labs every 6 months to monitor  Clinical Review Panels:  Prevention   Last Pap Smear:  Interpretation/Result:Negative for intraepithelial Lesion or Malignancy.    (12/09/2008)   Last Colonoscopy:  Location:  [facility].  (11/19/2008)  Immunizations   Last Flu Vaccine:  Historical (04/10/2009)   Last Pneumovax:  Historical (07/12/2007)  Lipid Management   Cholesterol:  152 (10/02/2009)   LDL (bad choesterol):  94 (10/02/2009)   HDL (good cholesterol):  41.60  (10/02/2009)   Triglycerides:  107 (06/04/2009)  CBC   WBC:  5.7 (06/04/2009)   RBC:  4.45 (06/04/2009)   Hgb:  13.1 (06/04/2009)   Hct:  40.9 (06/04/2009)   Platelets:  253 (06/04/2009)   MCV  91.9 (06/04/2009)   RDW  12.4 (06/04/2009)   PMN:  59 (06/04/2009)   Monos:  9 (06/04/2009)   Eosinophils:  1 (06/04/2009)   Basophil:  1 (06/04/2009)  Complete Metabolic Panel   Glucose:  96 (06/04/2009)   Sodium:  140 (06/04/2009)   Potassium:  4.4 (06/04/2009)   Chloride:  103 (06/04/2009)   CO2:  30 (06/04/2009)   BUN:  18 (06/04/2009)   Creatinine:  0.55 (06/04/2009)   Albumin:  3.8 (10/02/2009)   Total Protein:  6.6 (10/02/2009)   Calcium:  9.6 (06/04/2009)   Total Bili:  0.6 (10/02/2009)   Alk Phos:  80 (10/02/2009)   SGPT (ALT):  18 (10/02/2009)   SGOT (AST):  18 (10/02/2009)   Current Medications (verified): 1)  Azor 5-40 Mg Tabs (Amlodipine-Olmesartan) .[name].. 1 Tablet By Mouth Once Daily 2)  Aspirin 81 Mg  Tabs (Aspirin) .[name].. 1 Tablet By Mouth Once Daily 3)  Vitamin C 500 Mg Tabs (  Ascorbic Acid) .... Take 2 By Mouth Qd 4)  Citracal Plus D Tabs (Multiple Minerals-Vitamins) .... Take 1 Three Times A Day 5)  Simvastatin 10 Mg Tabs (Simvastatin) .[name].. 1 By Mouth Once Daily 6)  Plavix 75 Mg Tabs (Clopidogrel Bisulfate) .[name].. 1 By Mouth Once Daily 7)  Fish Oil 1000 Mg Caps (Omega-3 Fatty Acids) .[name].. 1 By Mouth Once Daily  Allergies (verified): 1)  ! Penicillin 2)  ! Terramycin  Past History:  Past Medical History: Right Thalamic Infarction 07/2006 Hypertension dyslipidemia Diverticulosis anxiety  MD rooster - gyn - [facility]- gessner cards - taylor  Review of Systems  The patient denies anorexia, fever, weight loss, chest pain, syncope, dyspnea on exertion, and peripheral edema.    Physical Exam  General:  alert, well-developed, well-nourished, and cooperative to examination.    Lungs:  normal respiratory effort, no intercostal retractions or use of  accessory muscles; normal breath sounds bilaterally - no crackles and no wheezes.    Heart:  normal rate, regular rhythm, soft 2/6 syst murmur, and no rub. BLE without edema.  Psych:  Oriented X3, memory intact for recent and remote, normally interactive, good eye contact, minimally anxious appearing, not depressed appearing, and not agitated.      Impression & Recommendations:  Problem # 1:  HYPERTENSION (ICD-401.9)  Her updated medication list for this problem includes:    Azor 5-40 Mg Tabs (Amlodipine-olmesartan) .[name]... 1 tablet by mouth once daily  BP today: 124/68 Prior BP: 134/68 (09/08/2009)  Labs Reviewed: K+: 4.4 (06/04/2009) Creat: : 0.55 (06/04/2009)   Chol: 152 (10/02/2009)   HDL: 41.60 (10/02/2009)   LDL: 94 (10/02/2009)   TG: 80.0 (10/02/2009)  Problem # 2:  DYSLIPIDEMIA (ICD-272.4)  check FLP and LFTs every 6 months Her updated medication list for this problem includes:    Simvastatin 10 Mg Tabs (Simvastatin) .[name]... 1 by mouth once daily  Labs Reviewed: SGOT: 18 (10/02/2009)   SGPT: 18 (10/02/2009)   HDL:41.60 (10/02/2009), 42 (06/04/2009)  LDL:94 (10/02/2009), 75 (06/04/2009)  Chol:152 (10/02/2009), 138 (06/04/2009)  Trig:80.0 (10/02/2009), 107 (06/04/2009)  Problem # 3:  ANXIETY STATE, UNSPECIFIED (ICD-300.00)  Discussed medication use and relaxation techniques.   Time spent with patient 25 minutes, more than 50% of this time was spent counseling patient on lipid tx, TIA hx and tx and reassurance re: no DM evidence  Orders: Glucose, (CBG) (09811)  Problem # 4:  PALPITATIONS (ICD-785.1)  The patient clearly has PVC's but also has symptoms that sound like she is having brief episodes of atrial fibrillation.  We have no documentation of this and I have asked her to call us if and when her symptoms become more severe.   however feels well off Bbloc, so removed from med list as pt is not taking (metoprolol) if symptoms worse again, she will contact us or cards for  further eval - reassurance provided  Complete Medication List: 1)  Azor 5-40 Mg Tabs (Amlodipine-olmesartan) .[name].. 1 tablet by mouth once daily 2)  Aspirin 81 Mg Tabs (Aspirin) .[name].. 1 tablet by mouth once daily 3)  Vitamin C 500 Mg Tabs (Ascorbic acid) .... Take 2 by mouth qd 4)  Citracal Plus D Tabs (multiple Minerals-vitamins)  .... Take 1 three times a day 5)  Simvastatin 10 Mg Tabs (Simvastatin) .[name].. 1 by mouth once daily 6)  Plavix 75 Mg Tabs (Clopidogrel bisulfate) .[name].. 1 by mouth once daily 7)  Fish Oil 1000 Mg Caps (Omega-3 fatty acids) .[name].. 1 by mouth once  daily  Patient Instructions: 1)  it was good to see you today.  2)  medications and labs reviewed today and 90day refills faxed to [facility] as requested 3)  Please schedule a follow-up appointment for last week of Sept (after the 25th) or early October , call sooner if problems.  Prescriptions: PLAVIX 75 MG TABS (CLOPIDOGREL BISULFATE) 1 by mouth once daily  #90 x 3   Entered by:   Orlan Leavens   Authorized by:   Newt Lukes MD   Signed by:   Orlan Leavens on 11/06/2009   Method used:   Faxed to ...       [facility]* (mail-order)             ,          Ph: [phone]       Fax: [phone]   RxID:   [id number] SIMVASTATIN 10 MG TABS (SIMVASTATIN) 1 by mouth once daily  #90 x 3   Entered by:   Orlan Leavens   Authorized by:   Newt Lukes MD   Signed by:   Orlan Leavens on 11/06/2009   Method used:   Faxed to ...       [facility]* (mail-order)             ,          Ph: [phone]       Fax: [phone]   RxID:   [id number] AZOR 5-40 MG TABS (AMLODIPINE-OLMESARTAN) 1 tablet by mouth once daily  #90 x 3   Entered by:   Orlan Leavens   Authorized by:   Newt Lukes MD   Signed by:   Orlan Leavens on 11/06/2009   Method used:   Faxed to ...       [facility]* (mail-order)             ,          Ph: [phone]       Fax: [phone]   RxID:   [id number]   Laboratory Results     Blood Tests     CBG Random:: 145mg /dL

## 2010-08-10 NOTE — Letter (Signed)
Summary: Screening results  Screening results   Imported By: Lester Ochelata 08/07/2009 14:33:36  _____________________________________________________________________  External Attachment:    Type:   Image     Comment:   External Document

## 2010-08-10 NOTE — Progress Notes (Signed)
Summary: Increased BP  Phone Note Call from Patient Call back at Home Phone [phone]   Caller: Patient Summary of Call: pt called stating that her BP has been fluctuating this weekend, with normal ranges except 168/89 sat and 150/? this am. pt has appt with Cardio 02/14 and is requesting appt be moved up or assessment from PCP. please advise Initial call taken by: Margaret Pyle, CMA,  August 10, 2009 10:47 AM  Follow-up for Phone Call        ok to schedule OV here  Follow-up by: Newt Lukes MD,  August 10, 2009 11:13 AM  Additional Follow-up for Phone Call Additional follow up Details #1::        pt informed and transferred to sch appt Additional Follow-up by: Margaret Pyle, CMA,  August 10, 2009 11:17 AM

## 2010-08-10 NOTE — Progress Notes (Signed)
  Phone Note Other Incoming   Request: Send information Summary of Call: Records received from [facility]. 22 pages forwarded to Dr. Felicity Coyer for review.

## 2010-09-03 ENCOUNTER — Ambulatory Visit (INDEPENDENT_AMBULATORY_CARE_PROVIDER_SITE_OTHER): Payer: Medicare Other | Admitting: Internal Medicine

## 2010-09-03 ENCOUNTER — Other Ambulatory Visit: Payer: Medicare Other

## 2010-09-03 ENCOUNTER — Other Ambulatory Visit: Payer: Self-pay | Admitting: Internal Medicine

## 2010-09-03 ENCOUNTER — Encounter: Payer: Self-pay | Admitting: Internal Medicine

## 2010-09-03 DIAGNOSIS — J209 Acute bronchitis, unspecified: Secondary | ICD-10-CM

## 2010-09-03 DIAGNOSIS — E785 Hyperlipidemia, unspecified: Secondary | ICD-10-CM

## 2010-09-03 DIAGNOSIS — Z131 Encounter for screening for diabetes mellitus: Secondary | ICD-10-CM

## 2010-09-03 DIAGNOSIS — I10 Essential (primary) hypertension: Secondary | ICD-10-CM

## 2010-09-03 DIAGNOSIS — R7303 Prediabetes: Secondary | ICD-10-CM | POA: Insufficient documentation

## 2010-09-03 DIAGNOSIS — Z79899 Other long term (current) drug therapy: Secondary | ICD-10-CM

## 2010-09-03 LAB — LIPID PANEL
Cholesterol: 146 mg/dL (ref 0–200)
HDL: 36.4 mg/dL — ABNORMAL LOW (ref >39.00)
LDL Cholesterol: 93 mg/dL (ref 0–99)
Total CHOL/HDL Ratio: 4

## 2010-09-03 LAB — HEPATIC FUNCTION PANEL: AST: 20 U/L (ref 0–37)

## 2010-09-07 NOTE — Assessment & Plan Note (Signed)
Summary: 3 MO ROV/NWS-- PT RS --CD   Vital Signs:  Patient profile:   76 year old female Weight:      140 pounds (63.64 kg) O2 Sat:      98 % on Room air Temp:     98.7 degrees F (37.06 degrees C) oral Pulse rate:   98 / minute BP sitting:   140 / 62  (left arm) Cuff size:   regular  Vitals Entered By: Orlan Leavens RMA (September 03, 2010 11:22 AM)  O2 Flow:  Room air CC: 3 month follow-up Is Patient Diabetic? No Pain Assessment Patient in pain? no      Comments also complaining og cold sxs   Primary Care Provider:  Newt Lukes MD  CC:  3 month follow-up.  History of Present Illness: c/o URI - onset 2 days ago, no fever, assoc with deep cough, nasal congestion and drainage and yellow sputum. not improved with otc meds  also review chronic med issues -  HTN - no longer taking Bbloc since seeing cards -  feels well controlled on azor, reports 100% med compliance - still feels fatigued but no CP or HA or edema or vision changes  palpitations -  saw cards for same in early 08/2009 - no longer taking Bbloc and does not feel this is making any difference in episodes -  episodes still occur 1-2x/week but not in any particular pattern or trigger  anxiety - uses xanax as needed - sleeping ok- has resumed exercise which helps reduce stress - primary concern today is FH of DM and wants her sugar checked  dylipidemia - labs reviewed from 03/2010 - no problems with current meds - reduced dose simva 04/09/10 - ?how to make HDL higher and if fish oil ok?-  very concerned re: possible  adv se and wants to check labs every 3 months to monitor   Clinical Review Panels:  Prevention   Last Mammogram:  ASSESSMENT: Negative - BI-RADS 1^MM DIGITAL SCREENING W/ IMPLANTS (04/09/2010)   Last Pap Smear:  Interpretation/Result:Negative for intraepithelial Lesion or Malignancy.    (12/09/2008)   Last Colonoscopy:  Location:  [facility].  (11/19/2008)  Lipid Management  Cholesterol:  140 (04/09/2010)   LDL (bad choesterol):  81 (04/09/2010)   HDL (good cholesterol):  39.10 (04/09/2010)   Triglycerides:  107 (06/04/2009)  Diabetes Management   HgBA1C:  6.2 (04/09/2010)   Creatinine:  0.6 (04/09/2010)   Last Flu Vaccine:  Fluvax 3+ (04/09/2010)   Last Pneumovax:  Historical (07/12/2007)  CBC   WBC:  9.0 (05/19/2010)   RBC:  4.21 (05/19/2010)   Hgb:  13.1 (05/19/2010)   Hct:  38.3 (05/19/2010)   Platelets:  236.0 (05/19/2010)   MCV  91.0 (05/19/2010)   MCHC  34.1 (05/19/2010)   RDW  12.7 (05/19/2010)   PMN:  64.3 (05/19/2010)   Lymphs:  25.4 (05/19/2010)   Monos:  8.7 (05/19/2010)   Eosinophils:  1.2 (05/19/2010)   Basophil:  0.4 (05/19/2010)  Complete Metabolic Panel   Glucose:  97 (04/09/2010)   Sodium:  140 (04/09/2010)   Potassium:  4.6 (04/09/2010)   Chloride:  104 (04/09/2010)   CO2:  30 (04/09/2010)   BUN:  18 (04/09/2010)   Creatinine:  0.6 (04/09/2010)   Albumin:  4.0 (05/19/2010)   Total Protein:  6.6 (05/19/2010)   Calcium:  9.3 (04/09/2010)   Total Bili:  0.6 (05/19/2010)   Alk Phos:  114 (05/19/2010)   SGPT (ALT):  18 (05/19/2010)   SGOT (AST):  16 (05/19/2010)   Current Medications (verified): 1)  Azor 5-40 Mg Tabs (Amlodipine-Olmesartan) .[name].. 1 Tablet By Mouth Once Daily 2)  Aspirin 81 Mg  Tabs (Aspirin) .[name].. 1 Tablet By Mouth Once Daily 3)  Vitamin C 500 Mg Tabs (Ascorbic Acid) .... Take 1 Two Times A Day 4)  Citracal Plus D Tabs (Multiple Minerals-Vitamins) .... Take 1 Three Times A Day 5)  Simvastatin 10 Mg Tabs (Simvastatin) .... 1/2 Tab By Mouth Once Daily 6)  Plavix 75 Mg Tabs (Clopidogrel Bisulfate) .[name].. 1 By Mouth Once Daily 7)  Fish Oil 1000 Mg Caps (Omega-3 Fatty Acids) .[name].. 1 By Mouth Once Daily  Allergies (verified): 1)  ! Penicillin 2)  ! Terramycin  Past History:  Past Medical History: Right Thalamic Infarction 07/2006 Hypertension dyslipidemia  Diverticulosis hx anxiety      MD roster - gyn  - [facility]- gessner cards - taylor  Review of Systems  The patient denies weight gain, chest pain, headaches, and abdominal pain.    Physical Exam  General:  alert, well-developed, well-nourished, and cooperative to examination.  nontoxic Lungs:  normal respiratory effort, no intercostal retractions or use of accessory muscles; normal breath sounds bilaterally - no crackles and no wheezes.    Heart:  normal rate, regular rhythm, no murmur, and no rub. BLE without edema.  Neurologic:  alert & oriented X3 and cranial nerves II-XII symetrically intact.  strength normal in all extremities, sensation intact to light touch, and gait normal. speech fluent without dysarthria or aphasia; follows commands with good comprehension.  Psych:  Oriented X3, memory intact for recent and remote, normally interactive, good eye contact, minimally anxious appearing, not depressed appearing, and not agitated.      Impression & Recommendations:  Problem # 1:  ACUTE BRONCHITIS (ICD-466.0)  Her updated medication list for this problem includes:    Azithromycin 250 Mg Tabs (Azithromycin) .[name]... 2 tabs by mouth today, then 1 by mouth daily starting tomorrow  Take antibiotics and other medications as directed. Encouraged to push clear liquids, get enough rest, and take acetaminophen as needed. To be seen in 5-7 days if no improvement, sooner if worse.  Orders: Prescription Created Electronically [phone])  Problem # 2:  DYSLIPIDEMIA (ICD-272.4)  Her updated medication list for this problem includes:    Simvastatin 10 Mg Tabs (Simvastatin) .[name]... 1/2 tab by mouth once daily  Orders: TLB-Lipid Panel (80061-LIPID)  check FLP and LFTs every 3-6 months at pt preference 03/2010 reduced simva by 1/2 (rx'd for CVA hx, never "bad chol" per pt) - will monitor for adv trend due to same  Labs Reviewed: SGOT: 16 (05/19/2010)   SGPT: 18 (05/19/2010)   HDL:39.10 (04/09/2010), 41.60 (10/02/2009)  LDL:81 (04/09/2010),  94 (56/21/3086)  Chol:140 (04/09/2010), 152 (10/02/2009)  Trig:100.0 (04/09/2010), 80.0 (10/02/2009)  Problem # 3:  HYPERTENSION (ICD-401.9) pt concerend with HR in 90s - prev 80s - i explained prior tx with metoprolol for palp sx would reduce HR - offered reassurance re: normal range hr Her updated medication list for this problem includes:    Azor 5-40 Mg Tabs (Amlodipine-olmesartan) .[name]... 1 tablet by mouth once daily  BP today: 140/62 Prior BP: 132/62 (05/19/2010)  Labs Reviewed: K+: 4.6 (04/09/2010) Creat: : 0.6 (04/09/2010)   Chol: 140 (04/09/2010)   HDL: 39.10 (04/09/2010)   LDL: 81 (04/09/2010)   TG: 100.0 (04/09/2010)  Problem # 4:  SCREENING, DIABETES MELLITUS (ICD-V77.1) pt insists on same given FH DM  Orders: TLB-A1C / Hgb A1C (Glycohemoglobin) (83036-A1C)  Complete Medication List: 1)  Azor 5-40 Mg Tabs (Amlodipine-olmesartan) .[name].. 1 tablet by mouth once daily 2)  Aspirin 81 Mg Tabs (Aspirin) .[name].. 1 tablet by mouth once daily 3)  Vitamin C 500 Mg Tabs (Ascorbic acid) .... Take 1 two times a day 4)  Citracal Plus D Tabs (multiple Minerals-vitamins)  .... Take 1 three times a day 5)  Simvastatin 10 Mg Tabs (Simvastatin) .... 1/2 tab by mouth once daily 6)  Plavix 75 Mg Tabs (Clopidogrel bisulfate) .[name].. 1 by mouth once daily 7)  Fish Oil 1000 Mg Caps (Omega-3 fatty acids) .[name].. 1 by mouth once daily 8)  Azithromycin 250 Mg Tabs (Azithromycin) .... 2 tabs by mouth today, then 1 by mouth daily starting tomorrow  Other Orders: TLB-Hepatic/Liver Function Pnl (80076-HEPATIC)  Patient Instructions: 1)  it was good to see you today. 2)  test(s) ordered today - your results will be mailed to you after review in 48-72 hours from the time of test completion;  if any changes need to be made or there are abnormal results, you will be notified directly 3)  Zpak for bronchitis - also Mucinex or plain robitussin for cough  4)  your prescription and refills have been electronically  submitted to your pharmacy([facility] at [facility], [facility] Please take as directed. Contact our office if you believe you're having problems with the medication(s).  5)  Please schedule a follow-up appointment in 6 months to monitor cholesterol and blood pressure and medications, call sooner if problems.  Prescriptions: PLAVIX 75 MG TABS (CLOPIDOGREL BISULFATE) 1 by mouth once daily  #90 x 3   Entered and Authorized by:   Newt Lukes MD   Signed by:   Newt Lukes MD on 09/03/2010   Method used:   [facility]ron[facility]        [facility] [address] [phone]* (retail)       [address]       [place], [place]  [address]       Ph: [phone]       Fax: [phone]   RxID:   [id number] SIMVASTATIN 10 MG TABS (SIMVASTATIN) 1/2 tab by mouth once daily  #90 x 3   Entered and Authorized by:   Newt Lukes MD   Signed by:   Newt Lukes MD on 09/03/2010   Method used:   [facility] [address] [phone]* (retail)       [address]       [place], [place]  [address]       Ph: [phone]       Fax: [phone]   RxID:   [id number] AZOR 5-40 MG TABS (AMLODIPINE-OLMESARTAN) 1 tablet by mouth once daily  #90 x 3   Entered and Authorized by:   Newt Lukes MD   Signed by:   Newt Lukes MD on 09/03/2010   Method used:   [facility]        [facility] [address] [phone]* (retail)       [address]       [place], [place]  [address]       Ph: [phone]       Fax: [phone]   RxID:   [id number] AZITHROMYCIN 250 MG TABS (  AZITHROMYCIN) 2 tabs by mouth today, then 1 by mouth daily starting tomorrow  #6 x 0   Entered and Authorized by:   Newt Lukes MD   Signed by:   Newt Lukes MD on 09/03/2010   Method used:   Electronically to        [facility] [phone]* (retail)       [address]       [place], [place]  [address]       Ph: [phone] or  [phone]       Fax: [phone]   RxID:   [id number]    Orders Added: 1)  TLB-A1C / Hgb A1C (Glycohemoglobin) [83036-A1C] 2)  TLB-Hepatic/Liver Function Pnl [80076-HEPATIC] 3)  TLB-Lipid Panel [80061-LIPID] 4)  Est. Patient Level IV [95638] 5)  Prescription Created Electronically [phone]

## 2010-09-08 ENCOUNTER — Ambulatory Visit: Payer: Medicare Other | Admitting: *Deleted

## 2010-09-08 ENCOUNTER — Telehealth: Payer: Self-pay | Admitting: Internal Medicine

## 2010-09-08 ENCOUNTER — Ambulatory Visit (INDEPENDENT_AMBULATORY_CARE_PROVIDER_SITE_OTHER): Payer: Medicare Other

## 2010-09-08 ENCOUNTER — Encounter: Payer: Self-pay | Admitting: Internal Medicine

## 2010-09-08 DIAGNOSIS — E119 Type 2 diabetes mellitus without complications: Secondary | ICD-10-CM

## 2010-09-08 LAB — CONVERTED CEMR LAB: Blood Glucose, Fingerstick: 100

## 2010-09-16 NOTE — Progress Notes (Signed)
Summary: Rx DM supplies       New/Updated Medications: ONETOUCH ULTRA 2 W/DEVICE KIT (BLOOD GLUCOSE MONITORING SUPPL) use as directed ONETOUCH ULTRA BLUE  STRP (GLUCOSE BLOOD) use as directed once daily ONETOUCH ULTRASOFT LANCETS  MISC (LANCETS) use as directed once daily Prescriptions: ONETOUCH ULTRASOFT LANCETS  MISC (LANCETS) use as directed once daily  #100 x 1   Entered by:   Margaret Pyle, CMA   Authorized by:   Newt Lukes MD   Signed by:   Margaret Pyle, CMA on 09/08/2010   Method used:   [facility]ronically to        [facility] [phone]* (retail)       [address]       [place], [place]  [address]       Ph: [phone]       Fax: [phone]   RxID:   [id number] ONETOUCH ULTRA BLUE  STRP (GLUCOSE BLOOD) use as directed once daily  #100 x 1   Entered by:   Margaret Pyle, CMA   Authorized by:   Newt Lukes MD   Signed by:   Margaret Pyle, CMA on 09/08/2010   Method used:   [facility]ron[facility] to        [facility] [phone]* (retail)       [address]       [place], [place]  [address]       Ph: [phone]       Fax: [phone]   RxID:   [id number] ONETOUCH ULTRA 2 W/DEVICE KIT (BLOOD GLUCOSE MONITORING SUPPL) use as directed  #1 x 0   Entered and Authorized by:   Margaret Pyle, CMA   Signed by:   Margaret Pyle, CMA on 09/08/2010   Method used:   Samples Given   RxID:   [id number]

## 2010-09-16 NOTE — Assessment & Plan Note (Signed)
Summary: blood sugar per lucy  Nurse Visit   Allergies: 1)  ! Penicillin 2)  ! Terramycin Laboratory Results   Blood Tests     CBG Random:: 100mg /dL     Orders Added: 1)  Est. Patient Level I [[address]]

## 2010-09-17 ENCOUNTER — Encounter: Payer: Medicare Other | Attending: Internal Medicine | Admitting: *Deleted

## 2010-09-17 DIAGNOSIS — E119 Type 2 diabetes mellitus without complications: Secondary | ICD-10-CM | POA: Insufficient documentation

## 2010-09-17 DIAGNOSIS — Z713 Dietary counseling and surveillance: Secondary | ICD-10-CM | POA: Insufficient documentation

## 2010-09-23 LAB — URINALYSIS, ROUTINE W REFLEX MICROSCOPIC
Glucose, UA: NEGATIVE mg/dL
Protein, ur: NEGATIVE mg/dL
Specific Gravity, Urine: 1.008 (ref 1.005–1.030)
Urobilinogen, UA: 1 mg/dL (ref 0.0–1.0)

## 2010-09-23 LAB — URINE CULTURE: Culture  Setup Time: 201109051057

## 2010-09-23 LAB — URINE MICROSCOPIC-ADD ON

## 2010-09-24 ENCOUNTER — Encounter: Payer: Medicare Other | Admitting: Dietician

## 2010-10-18 ENCOUNTER — Encounter: Payer: Self-pay | Admitting: Internal Medicine

## 2010-10-18 ENCOUNTER — Ambulatory Visit (INDEPENDENT_AMBULATORY_CARE_PROVIDER_SITE_OTHER)
Admission: RE | Admit: 2010-10-18 | Discharge: 2010-10-18 | Disposition: A | Discharge status: Home / Self Care | Payer: Medicare Other | Source: Ambulatory Visit | Attending: Internal Medicine | Admitting: Internal Medicine

## 2010-10-18 ENCOUNTER — Ambulatory Visit (INDEPENDENT_AMBULATORY_CARE_PROVIDER_SITE_OTHER): Payer: Medicare Other | Admitting: Internal Medicine

## 2010-10-18 DIAGNOSIS — R5381 Other malaise: Secondary | ICD-10-CM

## 2010-10-18 DIAGNOSIS — R5383 Other fatigue: Secondary | ICD-10-CM

## 2010-10-18 DIAGNOSIS — I10 Essential (primary) hypertension: Secondary | ICD-10-CM

## 2010-10-18 DIAGNOSIS — J209 Acute bronchitis, unspecified: Secondary | ICD-10-CM

## 2010-10-18 MED ORDER — PROMETHAZINE-CODEINE 6.25-10 MG/5ML PO SYRP: 5.0000 mL | ORAL_SOLUTION | ORAL | Status: DC | PRN

## 2010-10-18 MED ORDER — AZITHROMYCIN 250 MG PO TABS: ORAL_TABLET | ORAL | Status: DC

## 2010-10-18 NOTE — Progress Notes (Signed)
  Subjective:    Patient ID: Kristen Rivas, female    DOB: 1934-12-12, 76 y.o.   MRN: [id number]  HPI  C/o URI sx's since Sat. C/o ST, cough, weak.  Review of Systems  Constitutional: Positive for fever, chills and fatigue.  HENT: Positive for congestion, rhinorrhea, sneezing and postnasal drip.   Eyes: Positive for photophobia and pain. Negative for discharge and visual disturbance.  Respiratory: Positive for cough, shortness of breath and wheezing.   Cardiovascular: Positive for chest pain.  Gastrointestinal: Negative for vomiting, abdominal pain, diarrhea and abdominal distention.  Genitourinary: Negative for dysuria and difficulty urinating.  Skin: Negative for rash.  Neurological: Positive for dizziness, weakness and light-headedness.       Objective:   Physical Exam  Constitutional: She appears well-developed and well-nourished. She appears distressed.       Looks ill with a "cold", coughing  HENT:  Right Ear: Tympanic membrane is injected.  Left Ear: Tympanic membrane is injected.  Nose: Mucosal edema and rhinorrhea present. No epistaxis. Right sinus exhibits maxillary sinus tenderness. Left sinus exhibits maxillary sinus tenderness.  Mouth/Throat: No oropharyngeal exudate.  Eyes: Right eye exhibits no discharge. Left eye exhibits no discharge.       Erythematous throat  Neck: Neck supple. No JVD present.  Cardiovascular: Normal rate.  Exam reveals no gallop.   No murmur heard. Pulmonary/Chest: No accessory muscle usage. No respiratory distress. She has wheezes. She has no rales. She exhibits no tenderness.  Abdominal: She exhibits no distension.  Lymphadenopathy:    She has cervical adenopathy.  Skin: No rash noted. She is diaphoretic.  Psychiatric: Her behavior is normal.          Assessment & Plan:  ACUTE BRONCHITIS Will get a CXR to r/o pneumonia. Start Z pac and Prom - Phen cough syr.  FATIGUE Due to bronchitis  HYPERTENSION On Rx - cont  meds    F/u with Dr Felicity Coyer

## 2010-10-18 NOTE — Patient Instructions (Addendum)
Use over-the-counter  "cold" medicines  such as "Tylenol cold" , "Advil cold",  "Mucinex"  for cough and congestion. Avoid decongestants if you have high blood pressure and use "Afrin" nasal spray for nasal congestion as directed instead. Use" Delsym" or" Robitussin" cough syrup varietis for cough.  You can use plain "Tylenol" or "Advi"l for fever, chills and achyness.

## 2010-10-18 NOTE — Assessment & Plan Note (Signed)
On Rx - cont meds 

## 2010-10-18 NOTE — Assessment & Plan Note (Signed)
Due to bronchitis

## 2010-10-18 NOTE — Assessment & Plan Note (Signed)
Will get a CXR to r/o pneumonia. Start Z pac and Prom - Phen cough syr.

## 2010-10-21 ENCOUNTER — Telehealth: Payer: Self-pay

## 2010-10-21 NOTE — Telephone Encounter (Signed)
Bronchitis on CXR, otherwise OK Thx

## 2010-10-21 NOTE — Telephone Encounter (Signed)
Pt called requesting results of chest xray ordered by Dr Posey Rea 04/09, please advise.

## 2010-10-21 NOTE — Telephone Encounter (Signed)
Pt informed

## 2010-11-23 ENCOUNTER — Encounter: Payer: Self-pay | Admitting: Internal Medicine

## 2010-11-26 NOTE — Consult Note (Signed)
[name], [name]                  ACCOUNT NO.:  [id number]   MEDICAL RECORD NO.:  [id number]          PATIENT TYPE:  INP   LOCATION:  A214                          FACILITY:  [facility]   PHYSICIAN:  Kofi A. Gerilyn Pilgrim, M.D. DATE OF BIRTH:  25-Apr-1935   DATE OF CONSULTATION:  07/18/2006  DATE OF DISCHARGE:                                 CONSULTATION   REASON FOR CONSULTATION:  Numbness of the left upper extremity and left  facial area.   HISTORY OF PRESENT ILLNESS:  This is a 76 year old white female who has  a relatively unremarkable medical history.  She had some upper  respiratory tract symptoms a week before presenting and was given  steroids to which she responded very well.  The patient experienced the  sudden onset of dizziness, numbness and tingling involving the left  upper extremity and left facial area.  The symptoms lasted for several  minutes and improved but then came back a second time, again lasting for  several minutes.  The patient's blood pressure was checked at home and  it was thought to be elevated at 200/90.  She was subsequently brought  to the emergency department.  The patient apparently has not had  problems with hypertension or diabetes in the past, although she may  have been told of prehypertensive in the office at times with blood  pressure in the 140s.  There are reports of the patient having right  occipital headache and scatoma with these symptoms.  No slurred speech  is reported.   PAST MEDICAL HISTORY:  Relatively unremarkable.  1. She has had a carotid bruit and underwent carotid Doppler which was      unrevealing.  2. She also has had Cardiolite stress test which was also      unremarkable, about 4-5 years ago.   REVIEW OF SYSTEMS:  Unremarkable other than stated in history of present  illness.   CURRENT MEDICATIONS:  None.   ALLERGIES:  NO KNOWN DRUG ALLERGIES.   SOCIAL HISTORY:  She does not smoke and does not consume alcohol.  She  leads  a very active life.   FAMILY HISTORY:  Significant for cardiac disease, although she reports  her mother is relatively healthy and is 72 years old.   PHYSICAL EXAMINATION:  GENERAL APPEARANCE:  A thin, pleasant lady who  actually appears younger than stated age.  VITAL SIGNS:  Temperature 97.6, pulse 72, respirations 20, blood  pressure 129/69.  HEENT:  Head is normocephalic and atraumatic.  NECK:  Supple.  ABDOMEN:  Soft.  EXTREMITIES:  No significant edema.  There are some varicosities noted  in the feet.  MENTATION:  The patient is awake and alert.  Speech, language and  cognition are intact.  CRANIAL NERVES:  Pupils are equal, round, reactive to light and  accommodation.  Extraocular movements are full.  Visual fields are  intact.  Facial muscles range as normal.  Facial sensation is normal  today to light touch and temperature.  Tongue is midline.  Uvula is  midline.  Shoulder shrugs are normal.  Motor examination shows normal  tone, bulk and strength.  I see no pronator drift.  Coordination shows  no tremors, dysmetria, ataxia. Sensation today is normal to light touch  and temperature involving both the upper and lower extremities.  Reflexes are preserved and normal.  Plantar reflexes are downgoing.   Initial CT scan shows minimal microvascular ischemic white matter  changes, otherwise nothing acute.  MRI shows evidence of acute right  thalamic infarct with a tiny hyperintensity noted in the thalamic region  on diffusion imaging.  There is also similar findings noted on flare  imaging.   ASSESSMENT:  Acute right thalamic infarct.   RECOMMENDATIONS:  Agree with aspirin and Plavix for three months.  Long-  term, however, she should only be on a single antiplatelet agent, either  aspirin or Plavix.  She has carotid and echocardiography ordered and  these will be followed up.  I also agree with gentle blood pressure  control in this patient with a goal 130/85. She apparently  recently had  her pressures checked and those were fine.  May also consider checking  homocystine level.   Thank you for this consultation.      Kofi A. Gerilyn Pilgrim, M.D.  Electronically Signed     KAD/MEDQ  D:  07/18/2006  T:  07/18/2006  Job:  [id number]

## 2010-11-26 NOTE — Op Note (Signed)
[facility]  Patient:    Kristen Rivas, Kristen Rivas                         MRN: [id number] Proc. Date: 12/05/00 Adm. Date:  [id number] Disc. Date: [id number] Attending:  Tommy Medal CC:         Dr. Luciana Axe, Joice Holiday   Operative Report  PREOPERATIVE DIAGNOSIS:  Visual impairment from blepharochalasis of each upper eyelid.  POSTOPERATIVE DIAGNOSIS:  Visual impairment from blepharochalasis of each upper eyelid.  PROCEDURE:  Upper eyelid optical blepharoplasty on each side.  SURGEON:  Robert L. Dione Booze, M.D.  ANESTHESIA:  1% Xylocaine with epinephrine.  INDICATIONS AND JUSTIFICATION FOR PROCEDURE:  Alyrica Thurow was first seen in my office on February 17, 2000, and had moderately severe blepharochalasis and was complaining of swelling of her eyelids.  The problem was discussed, and she was seen on November 03, 2000, for a complete examination and could feel the weight of the skin of her eyelids and felt that she could see a shadow in the superior field.  Photographs were taken to document the problem, and a visual field test was performed that shows significant loss of the superior field when her eyelids are relaxed to compared to when her lid is taped open.  She decided to have the skin removed from each upper eyelid in order to open up th visual field.  Medically she should be stable to have this done.  Otherwise, the pupils, motility, conjunctiva, cornea, anterior chamber, and dilated fundus exam are normal, but at the slit lamp she does have early signs of cataract.  She is followed medically by Dr. Luciana Axe in [place].  She is having the skin removed from her upper eyelids to open up the visual field.  JUSTIFICATION FOR PERFORMING THE PROCEDURE IN AN OUTPATIENT SETTING:  Routine.  JUSTIFICATION FOR OVERNIGHT STAY:  None.  DESCRIPTION OF PROCEDURE:  The patient arrived in the minor surgery room at [facility] and was prepped  and draped.  The skin to be removed was carefully demarcated using a sterile marking pencil, and a frontal nerve block was given to each side.  The skin was then carefully removed using scissors and forceps and using a scalpel.  Underlying fatty tissue was also excised. Next, pressure was used to control bleeding, and each wound was sutured using a running 6-0 nylon suture.  Pressure patches were applied, and the patient left the minor room having done nicely.  FOLLOW-UP CARE:  The patient is to be seen in my office in six days to have the sutures removed.  She is to remove the patches in several hours.  She is to use Polysporin ointment in her eyes at night.  She is to use warm compresses twice daily. DD:  12/05/00 TD:  12/05/00 Job: [id number]

## 2010-11-26 NOTE — H&P (Signed)
Kristen Rivas, Kristen Rivas                  ACCOUNT NO.:  [id number]   MEDICAL RECORD NO.:  [id number]          PATIENT TYPE:  INP   LOCATION:  A214                          FACILITY:  [facility]   PHYSICIAN:  Patrica Duel, M.D.    DATE OF BIRTH:  Jan 30, 1935   DATE OF ADMISSION:  07/17/2006  DATE OF DISCHARGE:  LH                              HISTORY & PHYSICAL   CHIEF COMPLAINT:  Arm and facial numbness.   HISTORY OF PRESENT ILLNESS:  This is a 76 year old female with a  relatively benign history.  She has been treated within the past week  for a bronchitic infection with antibiotics and a small dose of  steroids, to which she responded very well.   The patient experienced an episode of numbness involving her left face  as well as her left arm, which resolved after several minutes.  This  recurred and involved her tongue.  Her blood pressure was checked at  home and found to be quite elevated at 200/90.  She was brought to the  emergency department for evaluation.   The patient also experienced some visual disturbance with several  scotomata and a right occipital headache, which was mild in nature.  She  had no nausea, vomiting, diarrhea, melena, hematemesis, chest pain or  dyspnea.   In the emergency department the patient's blood pressure was 162/76, she  was afebrile, heart rate 78 and normal.  Respirations 19.  O2 saturation  96%.  CT scan was obtained, which revealed subcortical microvascular  changes but no acute insult.  Hemogram and chemistries were unremarkable  except for a glucose of 122.  The patient's symptoms have resolved  spontaneously with no residual.   The patient is admitted with what appears to be a transient ischemic  attack, probably of the right middle cerebral artery.   The patient was noted to have a carotid bruit approximately 2 years ago.  She underwent a carotid Doppler study, which revealed mild plaque  formation only.  She had Cardiolite performed by  [facility], which was unremarkable, approximately 4-5 years ago.   Past history as noted.  She has never been treated for hypertension, and  her office blood pressures have been relatively normal.   REVIEW OF SYSTEMS:  Negative except as mentioned.   CURRENT MEDICATIONS:  None.   ALLERGIES:  None known.   SOCIAL HISTORY:  She is a nonsmoker, nondrinker.  Leads a very active  lifestyle.   FAMILY HISTORY:  Significant for cardiac disease in her father.  Her  mother just turned 42 years old and is relatively intact.   PHYSICAL EXAMINATION:  GENERAL:  A very pleasant female who is alert and  oriented, in no acute distress.  VITAL SIGNS:  As noted.  HEENT:  Normocephalic, atraumatic.  Pupils are equal.  Ears, nose,  throat benign.  NECK:  Supple.  There is a very soft bruit noted in the right carotid  region.  There are no masses, thyromegaly or lymphadenopathy noted.  LUNGS:  Clear to A&P.  CARDIAC:  Heart sounds are normal  without murmurs, rubs or gallops.  Rhythm is regular.  ABDOMEN:  Nontender, nondistended, bowel sounds intact.  EXTREMITIES:  No clubbing, cyanosis or edema.  NEUROLOGIC:  Totally nonfocal.  Motor, sensory, cerebellar functions,  cranial nerves II-XII are intact.  The toes are downgoing, and she is  fully alert and oriented with a baseline mental status.   ASSESSMENT:  Probable transient ischemic attack with marked elevation of  blood pressure documented at home.   PLAN:  Admit for full neurologic workup, neurology consult, empiric  Plavix, aspirin, and will add Norvasc for gentle moderation of blood  pressure.  Will follow and treat expectantly.      Patrica Duel, M.D.  Electronically Signed     MC/MEDQ  D:  07/17/2006  T:  07/18/2006  Job:  [id number]

## 2010-11-26 NOTE — Procedures (Signed)
NAMEJUNG, Kristen Rivas                  ACCOUNT NO.:  [id number]   MEDICAL RECORD NO.:  [id number]          PATIENT TYPE:  INP   LOCATION:  A214                          FACILITY:  [facility]   PHYSICIAN:  Dani Gobble, MD       DATE OF BIRTH:  10/19/1934   DATE OF PROCEDURE:  07/18/2006  DATE OF DISCHARGE:                                ECHOCARDIOGRAM   REFERRING PHYSICIAN:  Patrica Duel, M.D.   INDICATIONS:  A 76 year old gentleman with past medical history of  hypertension with left-sided numbness, worrisome for TIAs, who is  referred for evaluation of embolic source.   The aorta measures normally at 2.5 cm.   The left atrium measures normally at 3.8 cm.  The patient appeared to be  in sinus rhythm during this procedure.  No obvious clots or masses were  appreciated.   Interventricular septum was mildly thickened while the posterior wall  with within normal limits at 1.1 cm.  There did appear to be mild basal  septal hypertrophy without LVOT obstruction noted as is common in the  LV.   The aortic valve is trileaflet and mildly thickened but with normal  opening and no limitation to leaflet excursion.  No aortic insufficiency  is noted.  Doppler interrogation of the aortic valve is within normal  limits.   The mitral valve appears grossly structurally normal.  No mitral valve  prolapse is noted. Mild mitral annular calcification is noted.  Doppler  interrogation of the mitral valve is within normal limits.   Pulmonic valve is incompletely visualized.   Tricuspid valve appears grossly structurally normal with mild tricuspid  regurgitation noted.  There is no suggestion of pulmonary hypertension  by Doppler evaluation.   The left ventricle is normal in size with LV IDD measured at 3.9 cm,  LV  IC measured 2.7 cm.  Overall left ventricular systolic function is  normal, and no regional wall motion abnormalities are noted.   The right atrium appears normal in size.  The right  ventricle is mildly  to moderately dilated with normal right ventricular systolic function.   IMPRESSION:  1. Mild asymmetric septal hypertrophy with additional basal septal      hypertrophy overlay with out left ventricular outflow tract      obstruction as is common in the elderly.  2. Mild aortic sclerosis without stenosis.  3. Trivial mitral regurgitation.  4. Mild tricuspid regurgitation.  5. Normal left ventricular size and systolic function without regional      wall motion abnormality noted.  6. Mild to moderate right ventricular enlargement with normal right      ventricular systolic function.  7. No obvious source of emboli, but the study is not adequate for      assessment of subtle findings:  Consider TEE if clinically      indicated.           ______________________________  Dani Gobble, MD     AB/MEDQ  D:  07/18/2006  T:  07/18/2006  Job:  [id number]   cc:   Patrica Duel, M.D.  Fax: [phone]

## 2010-11-26 NOTE — Discharge Summary (Signed)
Kristen Rivas, Kristen Rivas                  ACCOUNT NO.:  [id number]   MEDICAL RECORD NO.:  [id number]          PATIENT TYPE:  INP   LOCATION:  A214                          FACILITY:  [facility]   PHYSICIAN:  Patrica Duel, M.D.    DATE OF BIRTH:  01-18-1935   DATE OF ADMISSION:  07/17/2006  DATE OF DISCHARGE:  01/09/2008LH                               DISCHARGE SUMMARY   DISCHARGE DIAGNOSIS:  Acute right thalamic infarction, probably  secondary to small vessel disease.   For details regarding admission, please refer to the admitting note.  Briefly, this 76 year old female with a benign past history experienced  an episode of numbness involving her left face, as well as left arm.  This did resolve after several minutes but recurred and involved her  tongue.  Her blood pressure was checked at home and found to be quite  elevated at 200/90.  She was then brought to the emergency room for  evaluation.  Of note, the patient experienced some visual disturbances  and scotomata, as well as a right occipital headache which was mild.  She had no nausea, vomiting, diarrhea or other symptoms.   In the emergency department, the patient's blood pressures was 162/76,  and she was stable.  A CT was obtained which revealed subcortical  microvascular changes but no acute insult.  She was admitted for what  appeared to be a transient ischemic attack.  Of note, the patient had a  carotid bruit evaluated 2 years ago with a Doppler which revealed mild  plaque formation only.  A Cardiolite has been performed 4-5 years prior  which was benign.   COURSE IN THE HOSPITAL:  The patient's symptoms completely resolved.  An  MRI showed evidence of an acute right thalamic infarction with a tiny  hyperintensity noted in the thalamic region on diffusion imaging.  Dopplers were negative.  Echocardiogram negative.  She was stable for  discharge on the second hospital day.   DISPOSITION:  Medications include:  1. Norvasc of  2.5 (begun this admission).  2. Plavix 75 daily.  3. Aspirin 81 daily.   She will be followed __________.      Patrica Duel, M.D.  Electronically Signed     MC/MEDQ  D:  07/27/2006  T:  07/27/2006  Job:  [id number]

## 2010-12-17 ENCOUNTER — Other Ambulatory Visit (INDEPENDENT_AMBULATORY_CARE_PROVIDER_SITE_OTHER): Payer: Medicare Other

## 2010-12-17 ENCOUNTER — Encounter: Payer: Self-pay | Admitting: Internal Medicine

## 2010-12-17 ENCOUNTER — Ambulatory Visit (INDEPENDENT_AMBULATORY_CARE_PROVIDER_SITE_OTHER): Payer: Medicare Other | Admitting: Internal Medicine

## 2010-12-17 DIAGNOSIS — E785 Hyperlipidemia, unspecified: Secondary | ICD-10-CM

## 2010-12-17 DIAGNOSIS — I6350 Cerebral infarction due to unspecified occlusion or stenosis of unspecified cerebral artery: Secondary | ICD-10-CM

## 2010-12-17 DIAGNOSIS — R5383 Other fatigue: Secondary | ICD-10-CM

## 2010-12-17 DIAGNOSIS — I10 Essential (primary) hypertension: Secondary | ICD-10-CM

## 2010-12-17 DIAGNOSIS — R5381 Other malaise: Secondary | ICD-10-CM

## 2010-12-17 DIAGNOSIS — E119 Type 2 diabetes mellitus without complications: Secondary | ICD-10-CM

## 2010-12-17 LAB — HEPATIC FUNCTION PANEL
Bilirubin, Direct: 0.1 mg/dL (ref 0.0–0.3)
Total Bilirubin: 0.6 mg/dL (ref 0.3–1.2)

## 2010-12-17 LAB — CBC WITH DIFFERENTIAL/PLATELET
Basophils Absolute: 0 10*3/uL (ref 0.0–0.1)
Eosinophils Absolute: 0.1 10*3/uL (ref 0.0–0.7)
Lymphocytes Relative: 29.6 % (ref 12.0–46.0)
Lymphs Abs: 2.2 10*3/uL (ref 0.7–4.0)
MCHC: 34.1 g/dL (ref 30.0–36.0)
Monocytes Relative: 7.7 % (ref 3.0–12.0)
Platelets: 240 10*3/uL (ref 150.0–400.0)
RDW: 12.9 % (ref 11.5–14.6)

## 2010-12-17 LAB — BASIC METABOLIC PANEL
CO2: 32 mEq/L (ref 19–32)
Calcium: 9.6 mg/dL (ref 8.4–10.5)
Sodium: 141 mEq/L (ref 135–145)

## 2010-12-17 LAB — LIPID PANEL
HDL: 45.9 mg/dL (ref >39.00)
Total CHOL/HDL Ratio: 4
VLDL: 24.4 mg/dL (ref 0.0–40.0)

## 2010-12-17 LAB — TSH: TSH: 1.96 u[IU]/mL (ref 0.35–5.50)

## 2010-12-17 LAB — HEMOGLOBIN A1C: Hgb A1c MFr Bld: 6.2 % (ref 4.6–6.5)

## 2010-12-17 NOTE — Assessment & Plan Note (Signed)
The current medical regimen is effective;  continue present plan and medications. BP Readings from Last 3 Encounters:  12/17/10 128/72  10/18/10 138/66  09/03/10 140/62

## 2010-12-17 NOTE — Assessment & Plan Note (Signed)
Strong FH - controls with diet - ongoing [facility] education with maggie [facility] recheck a1c now

## 2010-12-17 NOTE — Progress Notes (Signed)
  Subjective:    Patient ID: Kristen Rivas, female    DOB: 01-05-1935, 76 y.o.   MRN: [id number]  HPI  also review chronic med issues -   HTN - no longer taking Bbloc (stopped 08/2009)- feels well controlled on azor, reports 100% med compliance - still feels fatigued but no CP or HA or edema or vision changes   palpitations - saw cards for same in early 08/2009 - no longer taking Bbloc and does not feel this is making any difference in episodes - episodes still occur 1-2x/week but not in any particular pattern or trigger   anxiety - uses xanax as needed - sleeping ok- regular exercise which helps reduce stress -   DM2 - diet controlled -   dylipidemia - labs reviewed  - no problems with current meds - reduced dose simva 04/09/10 - very concerned re: possible adv se and wants to check labs every 3 months to monitor   Past Medical History  Diagnosis Date  . DYSLIPIDEMIA 09/08/2009  . HYPERTENSION 08/04/2009  . CARDIAC ARRHYTHMIA   . CVA 07/2006    right thalamic CVA  . Acute bronchitis 04/21/2010  . BACK PAIN, LUMBAR   . FATIGUE 04/09/2010  . Palpitations 08/04/2009  . URINARY INCONTINENCE   . FLANK PAIN, RIGHT 05/19/2010  . DIVERTICULITIS, HX OF 08/04/2009  . CHICKENPOX, HX OF 08/04/2009  . Anxiety state, unspecified   . DIABETES MELLITUS, TYPE II     Review of Systems  Respiratory: Negative for shortness of breath.   Cardiovascular: Negative for chest pain.  Neurological: Negative for headaches.       Objective:   Physical Exam BP 128/72  Pulse 84  Temp(Src) 97.7 F (36.5 C) (Oral)  Ht 5\' 6"  (1.676 m)  Wt 134 lb (60.782 kg)  BMI 21.63 kg/m2  SpO2 95% BP Readings from Last 3 Encounters:  12/17/10 128/72  10/18/10 138/66  09/03/10 140/62    Physical Exam  Constitutional: She is thin/fit; oriented to person, place, and time. She appears well-developed and well-nourished. No distress. Neck: Normal range of motion. Neck supple. No JVD present. No thyromegaly present.    Cardiovascular: Normal rate, regular rhythm and normal heart sounds.  No murmur heard. No BLE edema. Pulmonary/Chest: Effort normal and breath sounds normal. No respiratory distress. She has no wheezes.  Neurological: She is alert and oriented to person, place, and time. No cranial nerve deficit. Coordination normal.  Psychiatric: She has a normal mood and affect. Her behavior is normal. Judgment and thought content normal.   Wt Readings from Last 3 Encounters:  12/17/10 134 lb (60.782 kg)  10/18/10 136 lb (61.689 kg)  09/03/10 140 lb (63.504 kg)   Lab Results  Component Value Date   WBC 9.0 05/19/2010   HGB 13.1 05/19/2010   HCT 38.3 05/19/2010   PLT 236.0 05/19/2010   CHOL 146 09/03/2010   TRIG 84.0 09/03/2010   HDL 36.40* 09/03/2010   ALT 21 09/03/2010   AST 20 09/03/2010   NA 140 04/09/2010   K 4.6 04/09/2010   CL 104 04/09/2010   CREATININE 0.6 04/09/2010   BUN 18 04/09/2010   CO2 30 04/09/2010   TSH 2.51 04/09/2010   HGBA1C 6.5 09/03/2010       Assessment & Plan:  See problem list. Medications and labs reviewed today.

## 2010-12-17 NOTE — Assessment & Plan Note (Signed)
TIA 07/2006 - R thalamic - pt wishes to reduce meds - will take plavix and hold ASA 81

## 2010-12-17 NOTE — Patient Instructions (Signed)
It was good to see you today. Sign release for prior records as discussed Test(s) ordered today. Your results will be called to you after review (48-72hours after test completion). If any changes need to be made, you will be notified at that time. Stop aspirin and continue plavix for TIA history Please schedule followup in 3-4 months, call sooner if problems.

## 2010-12-17 NOTE — Assessment & Plan Note (Signed)
On statin - pt expects labs to be checked every 3 months - continue to reassure never has any problems and approp for annual testing

## 2010-12-18 ENCOUNTER — Encounter: Payer: Self-pay | Admitting: Internal Medicine

## 2010-12-23 ENCOUNTER — Telehealth: Payer: Self-pay | Admitting: Internal Medicine

## 2010-12-23 NOTE — Telephone Encounter (Signed)
Forwarded to Dr. Leschber for review.  °

## 2011-03-04 ENCOUNTER — Ambulatory Visit: Payer: Medicare Other | Admitting: Internal Medicine

## 2011-03-18 ENCOUNTER — Other Ambulatory Visit (INDEPENDENT_AMBULATORY_CARE_PROVIDER_SITE_OTHER): Payer: Medicare Other

## 2011-03-18 ENCOUNTER — Ambulatory Visit (INDEPENDENT_AMBULATORY_CARE_PROVIDER_SITE_OTHER): Payer: Medicare Other | Admitting: Internal Medicine

## 2011-03-18 ENCOUNTER — Encounter: Payer: Self-pay | Admitting: Internal Medicine

## 2011-03-18 VITALS — BP 120/60 | HR 82 | Temp 97.7°F | Ht 66.0 in | Wt 133.0 lb

## 2011-03-18 DIAGNOSIS — I10 Essential (primary) hypertension: Secondary | ICD-10-CM

## 2011-03-18 DIAGNOSIS — E119 Type 2 diabetes mellitus without complications: Secondary | ICD-10-CM

## 2011-03-18 DIAGNOSIS — R300 Dysuria: Secondary | ICD-10-CM

## 2011-03-18 DIAGNOSIS — Z79899 Other long term (current) drug therapy: Secondary | ICD-10-CM

## 2011-03-18 DIAGNOSIS — R5383 Other fatigue: Secondary | ICD-10-CM

## 2011-03-18 DIAGNOSIS — Z23 Encounter for immunization: Secondary | ICD-10-CM

## 2011-03-18 DIAGNOSIS — E785 Hyperlipidemia, unspecified: Secondary | ICD-10-CM

## 2011-03-18 DIAGNOSIS — R5381 Other malaise: Secondary | ICD-10-CM

## 2011-03-18 LAB — CBC WITH DIFFERENTIAL/PLATELET
Basophils Absolute: 0 10*3/uL (ref 0.0–0.1)
HCT: 40 % (ref 36.0–46.0)
Lymphs Abs: 1.7 10*3/uL (ref 0.7–4.0)
Monocytes Relative: 8.4 % (ref 3.0–12.0)
Neutrophils Relative %: 65.2 % (ref 43.0–77.0)
Platelets: 224 10*3/uL (ref 150.0–400.0)
RDW: 12.7 % (ref 11.5–14.6)
WBC: 6.8 10*3/uL (ref 4.5–10.5)

## 2011-03-18 LAB — URINALYSIS
Leukocytes, UA: NEGATIVE
Specific Gravity, Urine: 1.015 (ref 1.000–1.030)
Urobilinogen, UA: 0.2 (ref 0.0–1.0)
pH: 6 (ref 5.0–8.0)

## 2011-03-18 LAB — VITAMIN B12: Vitamin B-12: 346 pg/mL (ref 211–911)

## 2011-03-18 LAB — HEPATIC FUNCTION PANEL
Albumin: 4 g/dL (ref 3.5–5.2)
Alkaline Phosphatase: 94 U/L (ref 39–117)
Bilirubin, Direct: 0.1 mg/dL (ref 0.0–0.3)
Total Bilirubin: 0.9 mg/dL (ref 0.3–1.2)
Total Protein: 6.9 g/dL (ref 6.0–8.3)

## 2011-03-18 LAB — BASIC METABOLIC PANEL
CO2: 31 mEq/L (ref 19–32)
Calcium: 9.3 mg/dL (ref 8.4–10.5)
GFR: 114.91 mL/min (ref >60.00)
Potassium: 4.5 mEq/L (ref 3.5–5.1)
Sodium: 142 mEq/L (ref 135–145)

## 2011-03-18 LAB — HEMOGLOBIN A1C: Hgb A1c MFr Bld: 6.1 % (ref 4.6–6.5)

## 2011-03-18 LAB — TSH: TSH: 2.13 u[IU]/mL (ref 0.35–5.50)

## 2011-03-18 NOTE — Assessment & Plan Note (Signed)
Chronic, hx and exam unremarkable - check labs as requested

## 2011-03-18 NOTE — Assessment & Plan Note (Signed)
On statin - pt expects labs to be checked every 3 months - continue to reassure never has any problems and approp for annual testing

## 2011-03-18 NOTE — Progress Notes (Signed)
  Subjective:    Patient ID: Kristen Rivas, female    DOB: 1934/11/27, 77 y.o.   MRN: [id number]  HPI  Here for follow up - reviewed chronic med issues -   HTN - no longer taking Bbloc (stopped 08/2009)- feels well controlled on azor, reports 100% med compliance - still feels fatigued but no CP or HA or edema or vision changes   palpitations - saw cards for same in early 08/2009 - no longer taking Bbloc and does not feel this is making any difference in episodes - episodes still occur 1-2x/week but not in any particular pattern or trigger   anxiety - uses xanax as needed - sleeping ok- regular exercise which helps reduce stress -   DM2 - diet controlled -   dylipidemia - labs reviewed  - no problems with current meds - reduced dose simva 04/09/10 - very concerned re: possible adv se and wants to check labs every 3 months to monitor   Past Medical History  Diagnosis Date  . CARDIAC ARRHYTHMIA   . CVA 07/2006    right thalamic CVA  . Palpitations   . Anxiety state, unspecified   . DIABETES MELLITUS, TYPE II   . HYPERTENSION   . DYSLIPIDEMIA     Review of Systems  Constitutional: Positive for fatigue.  Respiratory: Negative for shortness of breath.   Cardiovascular: Negative for chest pain.  Neurological: Negative for headaches.       Objective:   Physical Exam  BP 120/60  Pulse 82  Temp(Src) 97.7 F (36.5 C) (Oral)  Ht 5\' 6"  (1.676 m)  Wt 133 lb (60.328 kg)  BMI 21.47 kg/m2  SpO2 97% BP Readings from Last 3 Encounters:  03/18/11 120/60  12/17/10 128/72  10/18/10 138/66   Constitutional: She is thin/fit; oriented to person, place, and time. She appears well-developed and well-nourished. No distress. Neck: Normal range of motion. Neck supple. No JVD present. No thyromegaly present.  Cardiovascular: Normal rate, regular rhythm and normal heart sounds.  No murmur heard. No BLE edema. Pulmonary/Chest: Effort normal and breath sounds normal. No respiratory distress. She has  no wheezes.  Neurological: She is alert and oriented to person, place, and time. No cranial nerve deficit. Coordination normal.  Psychiatric: She has a normal mood and affect. Her behavior is normal. Judgment and thought content normal.   Wt Readings from Last 3 Encounters:  03/18/11 133 lb (60.328 kg)  12/17/10 134 lb (60.782 kg)  10/18/10 136 lb (61.689 kg)   Lab Results  Component Value Date   WBC 7.3 12/17/2010   HGB 14.1 12/17/2010   HCT 41.4 12/17/2010   PLT 240.0 12/17/2010   CHOL 179 12/17/2010   TRIG 122.0 12/17/2010   HDL 45.90 12/17/2010   ALT 26 12/17/2010   AST 21 12/17/2010   NA 141 12/17/2010   K 5.1 12/17/2010   CL 104 12/17/2010   CREATININE 0.6 12/17/2010   BUN 20 12/17/2010   CO2 32 12/17/2010   TSH 1.96 12/17/2010   HGBA1C 6.2 12/17/2010       Assessment & Plan:  See problem list. Medications and labs reviewed today.

## 2011-03-18 NOTE — Patient Instructions (Signed)
It was good to see you today. Test(s) ordered today. Your results will be called to you after review (48-72hours after test completion). If any changes need to be made, you will be notified at that time. continue plavix for TIA history - other medications reviewed, no changes at this time. Please keep scheduled follow up in October as planned, call sooner if problems.

## 2011-03-18 NOTE — Assessment & Plan Note (Signed)
Strong FH - controls with diet - ongoing nutrition education with [facility] recheck a1c now Lab Results  Component Value Date   HGBA1C 6.2 12/17/2010

## 2011-03-18 NOTE — Assessment & Plan Note (Signed)
The current medical regimen is effective;  continue present plan and medications. BP Readings from Last 3 Encounters:  03/18/11 120/60  12/17/10 128/72  10/18/10 138/66

## 2011-03-24 ENCOUNTER — Telehealth: Payer: Self-pay | Admitting: *Deleted

## 2011-03-24 MED ORDER — ACCU-CHEK MULTICLIX LANCETS MISC: Status: DC

## 2011-03-24 MED ORDER — GLUCOSE BLOOD VI STRP: ORAL_STRIP | Status: DC

## 2011-03-24 NOTE — Telephone Encounter (Signed)
Called pt back let her know resent rx's, and ,ail copy of labs to her address.Kristen KitchenMarland Kitchen9/13/12@1 :48pm/LMB

## 2011-03-24 NOTE — Telephone Encounter (Signed)
Pt left msg stating pharmacy didn't received strips for her meter. Out of strips & lancets. Pls [facility] into [facility]. Also want copy of labs mail to her address.Marland KitchenMarland KitchenMarland Kitchen9/13/12@1 :43pm/LMB

## 2011-04-04 ENCOUNTER — Other Ambulatory Visit: Payer: Self-pay | Admitting: Internal Medicine

## 2011-04-04 DIAGNOSIS — Z1231 Encounter for screening mammogram for malignant neoplasm of breast: Secondary | ICD-10-CM

## 2011-04-13 ENCOUNTER — Ambulatory Visit
Admission: RE | Admit: 2011-04-13 | Discharge: 2011-04-13 | Disposition: A | Discharge status: Home / Self Care | Payer: Medicare Other | Source: Ambulatory Visit | Attending: Internal Medicine | Admitting: Internal Medicine

## 2011-04-13 ENCOUNTER — Encounter: Payer: Self-pay | Admitting: Internal Medicine

## 2011-04-13 ENCOUNTER — Other Ambulatory Visit: Payer: Self-pay | Admitting: Internal Medicine

## 2011-04-13 ENCOUNTER — Ambulatory Visit (INDEPENDENT_AMBULATORY_CARE_PROVIDER_SITE_OTHER): Payer: Medicare Other | Admitting: Internal Medicine

## 2011-04-13 VITALS — BP 130/52 | HR 87 | Temp 98.5°F | Ht 66.0 in | Wt 132.4 lb

## 2011-04-13 DIAGNOSIS — E785 Hyperlipidemia, unspecified: Secondary | ICD-10-CM

## 2011-04-13 DIAGNOSIS — I10 Essential (primary) hypertension: Secondary | ICD-10-CM

## 2011-04-13 DIAGNOSIS — E119 Type 2 diabetes mellitus without complications: Secondary | ICD-10-CM

## 2011-04-13 DIAGNOSIS — Z1231 Encounter for screening mammogram for malignant neoplasm of breast: Secondary | ICD-10-CM

## 2011-04-13 DIAGNOSIS — Z136 Encounter for screening for cardiovascular disorders: Secondary | ICD-10-CM

## 2011-04-13 DIAGNOSIS — Z0000 Encounter for general adult medical examination without abnormal findings: Secondary | ICD-10-CM

## 2011-04-13 MED ORDER — GLUCOSE BLOOD VI STRP: ORAL_STRIP | Status: DC

## 2011-04-13 MED ORDER — ACCU-CHEK MULTICLIX LANCETS MISC: Status: DC

## 2011-04-13 NOTE — Assessment & Plan Note (Signed)
On statin - pt expects labs to be checked every 3 months - continued to reassure she never has any problems and approp for annual testing,... but labs ordered today as requested

## 2011-04-13 NOTE — Progress Notes (Signed)
Subjective:    Patient ID: Kristen Rivas, female    DOB: 1934/11/01, 76 y.o.   MRN: [id number]  HPI   Here for medicare wellness  Diet: heart healthy and diabetic Physical activity: active Depression/mood screen: negative Hearing: intact to whispered voice Visual acuity: grossly normal, performs annual eye exam  ADLs: capable Fall risk: none Home safety: good Cognitive evaluation: intact to orientation, naming, recall and repetition EOL planning: adv directives, full code/ I agree  I have personally reviewed and have noted 1. The patient's medical and social history 2. Their use of alcohol, tobacco or illicit drugs 3. Their current medications and supplements 4. The patient's functional ability including ADL's, fall risks, home safety risks and hearing or visual impairment. 5. Diet and physical activities 6. Evidence for depression or mood disorders  Also reviewed chronic med issues -   HTN - no longer taking Bbloc (stopped 08/2009)- feels well controlled on azor, reports 100% med compliance - still feels fatigued but no CP or HA or edema or vision changes   palpitations - saw cards for same in early 08/2009 - no longer taking Bbloc and does not feel this is making any difference in episodes - episodes still occur 1-2x/week but not in any particular pattern or trigger   anxiety - uses xanax as needed - sleeping ok- regular exercise which helps reduce stress -   DM2 - diet controlled - checks cbgs 2x/d, none >120  dyslipidemia - labs reviewed  - no problems with current meds - reduced dose simva 04/09/10 - very concerned re: possible adv se and wants to check labs every 3 months to monitor   Past Medical History  Diagnosis Date  . CARDIAC ARRHYTHMIA   . CVA 07/2006    right thalamic CVA  . Palpitations   . Anxiety state, unspecified   . DIABETES MELLITUS, TYPE II   . HYPERTENSION   . DYSLIPIDEMIA    Family History  Problem Relation Age of Onset  . Arthritis Mother   .  Colon cancer Sister   . Lung cancer Sister   . Heart disease Brother   . Lung cancer Brother   . Diabetes Other     diabetes  . Arthritis Other   . Stroke Other     parent & grandparent   History  Substance Use Topics  . Smoking status: Never Smoker   . Smokeless tobacco: Not on file   Comment: Divorced, lives alone. Retired  . Alcohol Use: No    Review of Systems  Constitutional: Positive for fatigue.  Respiratory: Negative for shortness of breath.   Cardiovascular: Negative for chest pain.  Neurological: Negative for headaches.  No other specific complaints in a complete review of systems (except as listed in HPI above).      Objective:   Physical Exam  BP 130/52  Pulse 87  Temp(Src) 98.5 F (36.9 C) (Oral)  Ht 5\' 6"  (1.676 m)  Wt 132 lb 6.4 oz (60.056 kg)  BMI 21.37 kg/m2  SpO2 97% BP Readings from Last 3 Encounters:  04/13/11 130/52  03/18/11 120/60  12/17/10 128/72   Constitutional: She is thin/fit; oriented to person, place, and time. She appears well-developed and well-nourished. No distress. HENT: NCAT, TMs clear and hearing normal, OP clear without redness/exudate, good dentition Neck: Normal range of motion. Neck supple. No JVD present. No thyromegaly present.  Cardiovascular: Normal rate, regular rhythm and normal heart sounds.  No murmur heard. No BLE edema. Pulmonary/Chest: Effort normal  and breath sounds normal. No respiratory distress. She has no wheezes.  Abd: SNTND, +BS, no mass Mskel - normal range of motion, no deformities or joint effusions Neurological: alert and oriented to person, place, and time. No cranial nerve deficit. Coordination, balance, memory and gait normal.  Skin - no erythema, bruise or suspicious lesions Psychiatric: She has a normal mood and affect. Her behavior is normal. Judgment and thought content normal.   Wt Readings from Last 3 Encounters:  04/13/11 132 lb 6.4 oz (60.056 kg)  03/18/11 133 lb (60.328 kg)  12/17/10  134 lb (60.782 kg)   Lab Results  Component Value Date   WBC 6.8 03/18/2011   HGB 13.3 03/18/2011   HCT 40.0 03/18/2011   PLT 224.0 03/18/2011   CHOL 144 03/18/2011   TRIG 81.0 03/18/2011   HDL 43.10 03/18/2011   ALT 22 03/18/2011   AST 19 03/18/2011   NA 142 03/18/2011   K 4.5 03/18/2011   CL 105 03/18/2011   CREATININE 0.6 03/18/2011   BUN 19 03/18/2011   CO2 31 03/18/2011   TSH 2.13 03/18/2011   HGBA1C 6.1 03/18/2011   EKG: sinus with PACs - no ischemic changes     Assessment & Plan:   AWV - v70.0 /CPX - Today patient counseled on age appropriate routine health concerns for screening and prevention, each reviewed and up to date or declined. Immunizations reviewed and up to date or declined. Labs/ECG reviewed. Risk factors for depression reviewed and negative. Hearing function and visual acuity are intact. ADLs screened and addressed as needed. Functional ability and level of safety reviewed and appropriate. Education, counseling and referrals performed based on assessed risks today. Patient provided with a copy of personalized plan for preventive services.  See problem list. Medications and labs reviewed today.

## 2011-04-13 NOTE — Patient Instructions (Signed)
It was good to see you today. Medications reviewed, no changes at this time. Refill on medication(s) as discussed today. prescription to take to pharmacy for Zostavax - Please schedule followup in 6 months, call sooner if problems.

## 2011-04-13 NOTE — Assessment & Plan Note (Signed)
Strong FH - controls self with diet -  status post nutrition education with [facility] recheck a1c now Lab Results  Component Value Date   HGBA1C 6.1 03/18/2011

## 2011-04-13 NOTE — Assessment & Plan Note (Signed)
The current medical regimen is effective;  continue present plan and medications. BP Readings from Last 3 Encounters:  04/13/11 130/52  03/18/11 120/60  12/17/10 128/72

## 2011-04-22 LAB — HM DIABETES EYE EXAM: HM Diabetic Eye Exam: NORMAL

## 2011-04-25 ENCOUNTER — Telehealth: Payer: Self-pay | Admitting: Internal Medicine

## 2011-04-25 NOTE — Telephone Encounter (Signed)
Forwarded to Dr. Leschber for review.  °

## 2011-04-26 ENCOUNTER — Encounter: Payer: Self-pay | Admitting: Internal Medicine

## 2011-05-06 ENCOUNTER — Ambulatory Visit
Admission: RE | Admit: 2011-05-06 | Discharge: 2011-05-06 | Disposition: A | Discharge status: Home / Self Care | Payer: Medicare Other | Source: Ambulatory Visit | Attending: Internal Medicine | Admitting: Internal Medicine

## 2011-05-06 DIAGNOSIS — Z1231 Encounter for screening mammogram for malignant neoplasm of breast: Secondary | ICD-10-CM

## 2011-05-24 ENCOUNTER — Ambulatory Visit (INDEPENDENT_AMBULATORY_CARE_PROVIDER_SITE_OTHER): Payer: Medicare Other | Admitting: Internal Medicine

## 2011-05-24 ENCOUNTER — Encounter: Payer: Self-pay | Admitting: Internal Medicine

## 2011-05-24 VITALS — BP 128/60 | HR 96 | Temp 98.6°F

## 2011-05-24 DIAGNOSIS — N390 Urinary tract infection, site not specified: Secondary | ICD-10-CM

## 2011-05-24 LAB — POCT URINALYSIS DIPSTICK
Bilirubin, UA: NEGATIVE
Ketones, UA: NEGATIVE
Nitrite, UA: POSITIVE
pH, UA: 7

## 2011-05-24 MED ORDER — PHENAZOPYRIDINE HCL 200 MG PO TABS: 200.0000 mg | ORAL_TABLET | Freq: Three times a day (TID) | ORAL | Status: DC | PRN

## 2011-05-24 MED ORDER — NITROFURANTOIN MONOHYD MACRO 100 MG PO CAPS: 100.0000 mg | ORAL_CAPSULE | Freq: Two times a day (BID) | ORAL | Status: AC

## 2011-05-24 MED ORDER — ALPRAZOLAM 0.5 MG PO TABS: 0.5000 mg | ORAL_TABLET | Freq: Three times a day (TID) | ORAL | Status: DC | PRN

## 2011-05-24 NOTE — Progress Notes (Signed)
  Subjective:    Patient ID: Kristen Rivas, female    DOB: [birth year], 76 y.o.   MRN: [id number]  Urinary Tract Infection  This is a new problem. The current episode started yesterday. The problem occurs every urination. The problem has been gradually worsening. The quality of the pain is described as burning. The pain is moderate. There has been no fever. There is no history of pyelonephritis. Associated symptoms include frequency and urgency. Pertinent negatives include no chills, flank pain, hematuria, nausea or vomiting. She has tried increased fluids for the symptoms.    Past Medical History  Diagnosis Date  . CARDIAC ARRHYTHMIA   . CVA 07/2006    right thalamic CVA  . Palpitations   . Anxiety state, unspecified   . DIABETES MELLITUS, TYPE II   . HYPERTENSION   . DYSLIPIDEMIA     Review of Systems  Constitutional: Negative for chills.  Gastrointestinal: Negative for nausea and vomiting.  Genitourinary: Positive for urgency and frequency. Negative for hematuria and flank pain.       Objective:   Physical Exam BP 128/60  Pulse 96  Temp(Src) 98.6 F (37 C) (Oral)  SpO2 97% Gen: No acute distress, nontoxic Abdomen: Soft, nontender, no rebound or guarding, positive bowel sounds, no flank tenderness  Urine dip: Positive for leuk, nit      Assessment & Plan:  UTI. History of same with ER visit September 2011 for uti. Improvement at that time with Pyridium and Macrobid. Repeat treatment now, electronically prescription submitted. Patient to call symptoms unimproved, sooner if worse

## 2011-05-24 NOTE — Patient Instructions (Signed)
It was good to see you today. treatment for UTI as discussed: antibiotics macrobid and pyridium and refill alprazolam  Your prescription(s) have been submitted to your pharmacy. Please take as directed and contact our office if you believe you are having problem(s) with the medication(s).

## 2011-06-04 ENCOUNTER — Encounter: Payer: Self-pay | Admitting: Family Medicine

## 2011-06-04 ENCOUNTER — Other Ambulatory Visit: Payer: Self-pay | Admitting: Internal Medicine

## 2011-06-04 ENCOUNTER — Ambulatory Visit (INDEPENDENT_AMBULATORY_CARE_PROVIDER_SITE_OTHER): Payer: Medicare Other | Admitting: Family Medicine

## 2011-06-04 VITALS — BP 128/60 | HR 84 | Temp 98.8°F | Wt 133.0 lb

## 2011-06-04 DIAGNOSIS — N390 Urinary tract infection, site not specified: Secondary | ICD-10-CM

## 2011-06-04 MED ORDER — CIPROFLOXACIN HCL 250 MG PO TABS: 250.0000 mg | ORAL_TABLET | Freq: Two times a day (BID) | ORAL | Status: DC

## 2011-06-04 NOTE — Progress Notes (Signed)
  Subjective:    Patient ID: Kristen Rivas, female    DOB: 01-20-1935, 76 y.o.   MRN: [id number]  76 yo pt of Dr. Felicity Coyer presents to [facility] with persistent UTI symptoms.  Was seen on 11/13, UA positive for LE, nit, given macrobid (finished on 11/20). Symptoms improved initially, now with persistent dysuria.  She does not have a h/o pyleo.   Associated symptoms include frequency and urgency. Pertinent negatives include no chills, flank pain, hematuria, nausea or vomiting.   PCN allergic   Past Medical History  Diagnosis Date  . CARDIAC ARRHYTHMIA   . CVA 07/2006    right thalamic CVA  . Palpitations   . Anxiety state, unspecified   . DIABETES MELLITUS, TYPE II   . HYPERTENSION   . DYSLIPIDEMIA     Review of Systems  Constitutional: Negative for chills.  Gastrointestinal: Negative for nausea and vomiting.  Genitourinary: Positive for urgency and frequency. Negative for hematuria and flank pain.       Objective:   Physical Exam BP 128/60  Pulse 84  Temp(Src) 98.8 F (37.1 C) (Oral)  Wt 133 lb (60.328 kg) Gen: No acute distress, nontoxic Abdomen: Soft, nontender, no rebound or guarding, positive bowel sounds, no flank tenderness  Urine dip: pos LE- unable to read due to recent pyridium use      Assessment & Plan:   1. UTI (lower urinary tract infection)    Deteriorated. Will treat with 5 day course of cipro since she was just treated with macrobid. Advised follow up with primary MD for repeat UA and and culture. The patient indicates understanding of these issues and agrees with the plan.

## 2011-06-04 NOTE — Patient Instructions (Signed)
Please follow up with your doctor next week if symptoms continue.

## 2011-06-06 ENCOUNTER — Telehealth: Payer: Self-pay

## 2011-06-06 NOTE — Telephone Encounter (Signed)
Noted. Patient was also seen by Saturday clinic provider for same.

## 2011-06-06 NOTE — Telephone Encounter (Signed)
[facility] Triage Call Report Triage Record Num: [id number] Operator: Tana Felts Patient Name: Kristen Rivas Call Date & Time: 06/04/2011 9:55:16AM Patient Phone: [phone] PCP: Rene Paci Patient Gender: Female PCP Fax : [phone] Patient DOB: [birth year] [facility] Name: [facility] Reason for Call: Caller: Chitara/Patient; PCP: Rene Paci; CB#: [phone]; Call Reason: Urinary Pain/Bleeding; Sx Onset: 05/28/2011; Sx Notes: UTI x 1 week ago. Finished antibioitic 3 days ago, sxs returned with frequency, urgency, burning; Afebrile; Wt: ; Home treatment(s) tried: AZO; Did home treatment help?: No; Guideline Used:Urinary Symptoms; Disp:See Provider within 24 hours; Appt Scheduled?: Called appt line spoke with [facility]. Connected pt to appt line to schedule appt to be evaluated. Protocol(s) Used: Urinary Symptoms - Female Recommended Outcome per Protocol: See Provider within 24 hours Reason for Outcome: Evaluated by provider AND symptoms worsening after following recommended treatment plan for 72 hours Care Advice: ~ 06/04/2011 10:05:08AM Page 1 of 1 [email or web address]

## 2011-06-20 ENCOUNTER — Ambulatory Visit (INDEPENDENT_AMBULATORY_CARE_PROVIDER_SITE_OTHER): Payer: Medicare Other | Admitting: Internal Medicine

## 2011-06-20 ENCOUNTER — Encounter: Payer: Self-pay | Admitting: Internal Medicine

## 2011-06-20 VITALS — BP 110/60 | HR 86 | Temp 98.5°F

## 2011-06-20 DIAGNOSIS — N390 Urinary tract infection, site not specified: Secondary | ICD-10-CM

## 2011-06-20 LAB — POCT URINALYSIS DIPSTICK
Bilirubin, UA: NEGATIVE
Glucose, UA: NEGATIVE
Ketones, UA: NEGATIVE
Spec Grav, UA: 1.02

## 2011-06-20 MED ORDER — NITROFURANTOIN MONOHYD MACRO 100 MG PO CAPS: 100.0000 mg | ORAL_CAPSULE | Freq: Two times a day (BID) | ORAL | Status: AC

## 2011-06-20 NOTE — Progress Notes (Signed)
  Subjective:    Patient ID: Kristen Rivas, female    DOB: 03/21/1935, 76 y.o.   MRN: [id number]  HPI complains of recurrent UTI symptoms:  Onset current symptoms 3 days ago - but recurrent symptoms: 3rd in past 1 month improved with macrobid x 7 d in early Nov, then Cipro x 5 days in end Nov associated with dysuria and small volume voiding with increased frequency denies hematuria, flank pain or fever The patient has a history of same  Past Medical History  Diagnosis Date  . CARDIAC ARRHYTHMIA   . CVA 07/2006    right thalamic CVA  . Palpitations   . Anxiety state, unspecified   . DIABETES MELLITUS, TYPE II   . HYPERTENSION   . DYSLIPIDEMIA     Review of Systems  Respiratory: Negative for cough and shortness of breath.   Cardiovascular: Negative for chest pain and leg swelling.  Gastrointestinal: Negative for abdominal pain.  Genitourinary: Negative for flank pain, enuresis and dyspareunia.       Objective:   Physical Exam BP 110/60  Pulse 86  Temp(Src) 98.5 F (36.9 C) (Oral)  SpO2 97% General: Nontoxic, no acute distress Lungs: Clear to auscultation bilaterally Cardiovascular regular rate and rhythm Abdomen: Soft, mild discomfort of her suprapubic region. No flank pain to palpation  U dip: Negative nitrates, mild hemoglobin     Assessment & Plan:  UTI, recurrent Third episode in 4 weeks Send for urine culture to confirm or deny the presence of bacteria Treat again with empiric antibiotics, patient prefers Macrobid Continue pyridium as needed Consider urology consultation as needed

## 2011-06-20 NOTE — Patient Instructions (Signed)
It was good to see you today. Macrobid antibiotics 2x/day for 10days - Your prescription(s) have been submitted to your pharmacy. Please take as directed and contact our office if you believe you are having problem(s) with the medication(s). Continue hydration with no carbination and try cranberry juice as discussed

## 2011-07-22 ENCOUNTER — Ambulatory Visit (INDEPENDENT_AMBULATORY_CARE_PROVIDER_SITE_OTHER): Payer: Medicare Other | Admitting: Internal Medicine

## 2011-07-22 ENCOUNTER — Other Ambulatory Visit: Payer: Medicare Other

## 2011-07-22 ENCOUNTER — Encounter: Payer: Self-pay | Admitting: Internal Medicine

## 2011-07-22 VITALS — BP 132/62 | HR 83 | Temp 98.2°F

## 2011-07-22 DIAGNOSIS — N390 Urinary tract infection, site not specified: Secondary | ICD-10-CM | POA: Diagnosis not present

## 2011-07-22 LAB — POCT URINALYSIS DIPSTICK
Bilirubin, UA: NEGATIVE
Blood, UA: NEGATIVE
Glucose, UA: NEGATIVE
Ketones, UA: NEGATIVE
Nitrite, UA: NEGATIVE
Protein, UA: NEGATIVE
Spec Grav, UA: 1.02
Urobilinogen, UA: 0.2
pH, UA: 7

## 2011-07-22 MED ORDER — CIPROFLOXACIN HCL 500 MG PO TABS: 500.0000 mg | ORAL_TABLET | Freq: Two times a day (BID) | ORAL | Status: AC

## 2011-07-22 NOTE — Patient Instructions (Signed)
It was good to see you today. Will send urine for culture and check on prior 06/2011 results (if available) cipro antibiotics 2x/day for 5 days - Your prescription(s) have been submitted to your pharmacy. Please take as directed and contact our office if you believe you are having problem(s) with the medication(s). Continue hydration and other home remedies as needed

## 2011-07-22 NOTE — Progress Notes (Signed)
  Subjective:    Patient ID: Kristen Rivas, female    DOB: 08/17/1934, 77 y.o.   MRN: [id number]  Urinary Tract Infection  Pertinent negatives include no flank pain.   complains of recurrent UTI symptoms:  Onset current symptoms 3 days ago - but recurrent symptoms: 4th since 05/26/11 (last 2 months) improved with macrobid x 7 d in early Nov, then Cipro x 5 days in end Nov; the macrobid x 7 d mid Dec associated with dysuria and small volume voiding with increased frequency denies hematuria, flank pain or fever The patient has a history of same  Past Medical History  Diagnosis Date  . CARDIAC ARRHYTHMIA   . CVA 07/2006    right thalamic CVA  . Palpitations   . Anxiety state, unspecified   . DIABETES MELLITUS, TYPE II   . HYPERTENSION   . DYSLIPIDEMIA     Review of Systems  Respiratory: Negative for cough and shortness of breath.   Cardiovascular: Negative for chest pain and leg swelling.  Gastrointestinal: Negative for abdominal pain.  Genitourinary: Negative for flank pain, enuresis and dyspareunia.       Objective:   Physical Exam  BP 132/62  Pulse 83  Temp(Src) 98.2 F (36.8 C) (Oral)  SpO2 96% General: Nontoxic, no acute distress Lungs: Clear to auscultation bilaterally Cardiovascular regular rate and rhythm Abdomen: Soft, mild discomfort of her suprapubic region. No flank pain to palpation  U dip: Negative nitrates, LE and heme     Assessment & Plan:  UTI, recurrent > classic symptoms despite neg Udip 4th episode in 8 weeks Send for urine culture to confirm or deny the presence of bacteria (not done 06/2011 as ordered) Treat again with empiric antibiotics, patient prefers cipro Continue pyridium as needed Consider urology consultation as needed (if + Cx)

## 2011-07-25 LAB — URINE CULTURE: Colony Count: >=100000

## 2011-08-19 DIAGNOSIS — D485 Neoplasm of uncertain behavior of skin: Secondary | ICD-10-CM | POA: Diagnosis not present

## 2011-08-19 DIAGNOSIS — Z808 Family history of malignant neoplasm of other organs or systems: Secondary | ICD-10-CM | POA: Diagnosis not present

## 2011-08-19 DIAGNOSIS — L820 Inflamed seborrheic keratosis: Secondary | ICD-10-CM | POA: Diagnosis not present

## 2011-08-19 DIAGNOSIS — B078 Other viral warts: Secondary | ICD-10-CM | POA: Diagnosis not present

## 2011-08-23 ENCOUNTER — Ambulatory Visit (INDEPENDENT_AMBULATORY_CARE_PROVIDER_SITE_OTHER): Payer: Medicare Other | Admitting: Internal Medicine

## 2011-08-23 ENCOUNTER — Other Ambulatory Visit (INDEPENDENT_AMBULATORY_CARE_PROVIDER_SITE_OTHER): Payer: Medicare Other

## 2011-08-23 ENCOUNTER — Encounter: Payer: Self-pay | Admitting: Internal Medicine

## 2011-08-23 VITALS — BP 130/62 | HR 80 | Temp 98.0°F

## 2011-08-23 DIAGNOSIS — E119 Type 2 diabetes mellitus without complications: Secondary | ICD-10-CM

## 2011-08-23 DIAGNOSIS — R197 Diarrhea, unspecified: Secondary | ICD-10-CM

## 2011-08-23 DIAGNOSIS — N390 Urinary tract infection, site not specified: Secondary | ICD-10-CM

## 2011-08-23 DIAGNOSIS — E785 Hyperlipidemia, unspecified: Secondary | ICD-10-CM | POA: Diagnosis not present

## 2011-08-23 LAB — POCT URINALYSIS DIPSTICK
Blood, UA: NEGATIVE
Glucose, UA: NEGATIVE
Nitrite, UA: NEGATIVE
Protein, UA: NEGATIVE
Spec Grav, UA: 1.02
Urobilinogen, UA: 0.2

## 2011-08-23 LAB — CBC WITH DIFFERENTIAL/PLATELET
Basophils Relative: 0.3 % (ref 0.0–3.0)
Eosinophils Relative: 1.2 % (ref 0.0–5.0)
Hemoglobin: 13.1 g/dL (ref 12.0–15.0)
Lymphocytes Relative: 22.7 % (ref 12.0–46.0)
MCV: 90.5 fl (ref 78.0–100.0)
Monocytes Absolute: 0.5 10*3/uL (ref 0.1–1.0)
Neutrophils Relative %: 68.7 % (ref 43.0–77.0)
RBC: 4.33 Mil/uL (ref 3.87–5.11)
WBC: 7.7 10*3/uL (ref 4.5–10.5)

## 2011-08-23 LAB — LIPID PANEL
HDL: 48 mg/dL (ref >39.00)
LDL Cholesterol: 96 mg/dL (ref 0–99)
Total CHOL/HDL Ratio: 3
VLDL: 10.4 mg/dL (ref 0.0–40.0)

## 2011-08-23 LAB — HEPATIC FUNCTION PANEL
Albumin: 3.9 g/dL (ref 3.5–5.2)
Alkaline Phosphatase: 86 U/L (ref 39–117)
Total Protein: 6.7 g/dL (ref 6.0–8.3)

## 2011-08-23 LAB — BASIC METABOLIC PANEL
Chloride: 104 mEq/L (ref 96–112)
Creatinine, Ser: 0.5 mg/dL (ref 0.4–1.2)
Sodium: 140 mEq/L (ref 135–145)

## 2011-08-23 LAB — HEMOGLOBIN A1C: Hgb A1c MFr Bld: 6.2 % (ref 4.6–6.5)

## 2011-08-23 NOTE — Assessment & Plan Note (Signed)
Strong FH - controls self with diet -  status post nutrition education with maggie at [facility] recheck a1c now Lab Results  Component Value Date   HGBA1C 6.1 03/18/2011   

## 2011-08-23 NOTE — Assessment & Plan Note (Signed)
On statin - pt expects labs to be checked every 3 months - continued to reassure she never has any problems and approp for annual testing,... but labs ordered today as requested  

## 2011-08-23 NOTE — Patient Instructions (Signed)
It was good to see you today. Test(s) ordered today. Your results will be called to you after review (48-72hours after test completion). If any changes need to be made, you will be notified at that time. Continue Activa or other probiotics Will consider need for urology evaluation due to UTI symptoms depending on results of Urine culture today If you develop worsening symptoms or fever, call and we can reconsider antibiotics, but it does not appear necessary to use antibiotics at this time. Please schedule followup in 3-4 months, call sooner if problems.

## 2011-08-23 NOTE — Progress Notes (Signed)
Subjective:    Patient ID: Kristen Rivas, female    DOB: October 19, 1934, 77 y.o.   MRN: [id number]  Urinary Tract Infection  Pertinent negatives include no flank pain.   complains of recurrent UTI symptoms:  5 infections episodes since 05/26/11 (last 3 months) improved with macrobid x 7 d early Nov, then Cipro x 5d end Nov; the macrobid x 7d mid Dec and Cipro 07/2011 for Citrobacter on Ucx associated with dysuria and small volume voiding with increased frequency, currently improved denies hematuria, flank pain or fever The patient has a history of same  also reviewed chronic medical issues -   HTN - no longer taking beta-blocker (stopped 08/2009)- feels well controlled on azor, reports 100% med compliance - still feels fatigued but no chest pain, headache or edema or vision changes   palpitations - saw cards for same in early 08/2009 - no longer taking beta-blocker as did not feel beta-blocker making any difference in episodes - episodes still occur 1-2x/week but not in any particular pattern or trigger   anxiety - uses xanax as needed - sleeping ok- regular exercise which helps reduce stress -   DM2 - diet controlled - strong FH and monitors carefully home cbgs  dyslipidemia - labs reviewed  - no problems with current meds - reduced dose simva 04/09/10 - very concerned re: possible adv side effects and wants to check labs every 3 months to monitor    Past Medical History  Diagnosis Date  . CARDIAC ARRHYTHMIA   . CVA 07/2006    right thalamic CVA  . Palpitations   . Anxiety state, unspecified   . DIABETES MELLITUS, TYPE II   . HYPERTENSION   . DYSLIPIDEMIA     Review of Systems  Respiratory: Negative for cough and shortness of breath.   Cardiovascular: Negative for chest pain and leg swelling.  Gastrointestinal: Positive for diarrhea (loose sttol x 3-4 weeks, 304x/day, imporved with probiotic use over past 7 days). Negative for abdominal pain.  Genitourinary: Negative for flank  pain, enuresis and dyspareunia.       Objective:   Physical Exam  BP 130/62  Pulse 80  Temp(Src) 98 F (36.7 C) (Oral)  SpO2 97% Wt Readings from Last 3 Encounters:  06/04/11 133 lb (60.328 kg)  04/13/11 132 lb 6.4 oz (60.056 kg)  03/18/11 133 lb (60.328 kg)   Constitutional: She is thin/fit; appears well-developed and well-nourished. No distress. Neck: Normal range of motion. Neck supple. No JVD present. No thyromegaly present.  Cardiovascular: Normal rate, regular rhythm and normal heart sounds.  No murmur heard. No BLE edema. Pulmonary/Chest: Effort normal and breath sounds normal. No respiratory distress. She has no wheezes.  Abdomen: Soft, mild discomfort of her suprapubic region. No flank pain to palpation Neurological: She is alert and oriented to person, place, and time. No cranial nerve deficit. Coordination normal.  Psychiatric: She is pleasant with slightly anxious mood and affect. Her behavior is normal. Judgment and thought content normal.    U dip: Negative nitrates, LE and heme  Lab Results  Component Value Date   WBC 6.8 03/18/2011   HGB 13.3 03/18/2011   HCT 40.0 03/18/2011   PLT 224.0 03/18/2011   GLUCOSE 105* 03/18/2011   CHOL 144 03/18/2011   TRIG 81.0 03/18/2011   HDL 43.10 03/18/2011   LDLCALC 85 03/18/2011   ALT 22 03/18/2011   AST 19 03/18/2011   NA 142 03/18/2011   K 4.5 03/18/2011   CL 105 03/18/2011  CREATININE 0.6 03/18/2011   BUN 19 03/18/2011   CO2 31 03/18/2011   TSH 2.13 03/18/2011   HGBA1C 6.1 03/18/2011      Assessment & Plan:  UTI, recurrent > classic symptoms despite neg Udip Recheck Ucx due to symptoms and refer for urology consultation as needed, no antibiotics unless +Ucx  Diarrhea, loose bowels 3-4/d since completing antibiotics - multiple rounds of tx for UTI, r/o C diff - improved with Activa (probiotic), continue same and hold antibiotics unless toxic, worse or abnormal stool sample

## 2011-08-24 LAB — URINE CULTURE: Organism ID, Bacteria: NO GROWTH

## 2011-08-26 ENCOUNTER — Other Ambulatory Visit: Payer: Medicare Other

## 2011-08-26 DIAGNOSIS — R197 Diarrhea, unspecified: Secondary | ICD-10-CM | POA: Diagnosis not present

## 2011-08-27 LAB — CLOSTRIDIUM DIFFICILE EIA: CDIFTX: NEGATIVE

## 2011-10-14 ENCOUNTER — Ambulatory Visit: Payer: Medicare Other | Admitting: Internal Medicine

## 2011-10-31 ENCOUNTER — Other Ambulatory Visit: Payer: Self-pay | Admitting: *Deleted

## 2011-10-31 MED ORDER — AMLODIPINE-OLMESARTAN 5-40 MG PO TABS: 1.0000 | ORAL_TABLET | Freq: Every day | ORAL | Status: DC

## 2011-10-31 MED ORDER — CLOPIDOGREL BISULFATE 75 MG PO TABS: 75.0000 mg | ORAL_TABLET | Freq: Every day | ORAL | Status: DC

## 2011-10-31 NOTE — Telephone Encounter (Signed)
done

## 2011-12-14 ENCOUNTER — Other Ambulatory Visit: Payer: Self-pay | Admitting: *Deleted

## 2011-12-14 ENCOUNTER — Other Ambulatory Visit: Payer: Self-pay

## 2011-12-14 MED ORDER — SIMVASTATIN 10 MG PO TABS: 5.0000 mg | ORAL_TABLET | Freq: Every day | ORAL | Status: DC

## 2012-02-13 ENCOUNTER — Encounter: Payer: Self-pay | Admitting: Internal Medicine

## 2012-02-13 ENCOUNTER — Ambulatory Visit (INDEPENDENT_AMBULATORY_CARE_PROVIDER_SITE_OTHER): Payer: Medicare Other | Admitting: Internal Medicine

## 2012-02-13 VITALS — BP 112/52 | HR 91 | Temp 98.5°F | Ht 66.0 in | Wt 136.4 lb

## 2012-02-13 DIAGNOSIS — J040 Acute laryngitis: Secondary | ICD-10-CM | POA: Diagnosis not present

## 2012-02-13 DIAGNOSIS — R05 Cough: Secondary | ICD-10-CM

## 2012-02-13 DIAGNOSIS — F411 Generalized anxiety disorder: Secondary | ICD-10-CM | POA: Diagnosis not present

## 2012-02-13 DIAGNOSIS — R059 Cough, unspecified: Secondary | ICD-10-CM

## 2012-02-13 DIAGNOSIS — J029 Acute pharyngitis, unspecified: Secondary | ICD-10-CM

## 2012-02-13 MED ORDER — PROMETHAZINE-CODEINE 6.25-10 MG/5ML PO SYRP: 5.0000 mL | ORAL_SOLUTION | ORAL | Status: AC | PRN

## 2012-02-13 MED ORDER — ALPRAZOLAM 0.5 MG PO TABS: 0.5000 mg | ORAL_TABLET | Freq: Three times a day (TID) | ORAL | Status: DC | PRN

## 2012-02-13 MED ORDER — AZITHROMYCIN 250 MG PO TABS: ORAL_TABLET | ORAL | Status: AC

## 2012-02-13 NOTE — Assessment & Plan Note (Signed)
Refill xanax - no acute change symptoms

## 2012-02-13 NOTE — Progress Notes (Signed)
Subjective:    Patient ID: Kristen Rivas, female    DOB: 1934/11/27, 77 y.o.   MRN: [id number]  Cough This is a new problem. The current episode started in the past 7 days. The problem has been gradually worsening. The problem occurs hourly. The cough is non-productive. Associated symptoms include ear pain, myalgias, nasal congestion, postnasal drip and a sore throat. Pertinent negatives include no chest pain, fever, headaches, heartburn, hemoptysis, rash, shortness of breath or weight loss. The symptoms are aggravated by lying down. Risk factors for lung disease include travel. She has tried OTC cough suppressant for the symptoms. The treatment provided no relief. Her past medical history is significant for environmental allergies. There is no history of asthma, bronchitis, COPD or emphysema.  Sore Throat  This is a new problem. The current episode started in the past 7 days. The problem has been gradually improving. Associated symptoms include coughing, ear pain, a hoarse voice and swollen glands. Pertinent negatives include no abdominal pain, diarrhea, drooling, headaches, shortness of breath or trouble swallowing. She has had no exposure to strep or mono. She has tried cool liquids and gargles for the symptoms. The treatment provided mild relief.   also reviewed chronic medical issues -   HTN - no longer taking beta-blocker (stopped 08/2009)- feels well controlled on azor, reports 100% med compliance - still feels fatigued but no chest pain, headache or edema or vision changes   palpitations - saw cards for same in early 08/2009 - no longer taking beta-blocker as did not feel beta-blocker making any difference in episodes - episodes still occur 1-2x/week but not in any particular pattern or trigger   anxiety - uses xanax as needed - sleeping ok- regular exercise which helps reduce stress -   DM2 - diet controlled - strong FH and monitors carefully home cbgs  dyslipidemia - labs reviewed  - no  problems with current meds - reduced dose simva 04/09/10 - very concerned re: possible adv side effects and wants to check labs every 3 months to monitor    Past Medical History  Diagnosis Date  . CARDIAC ARRHYTHMIA   . CVA 07/2006    right thalamic CVA  . Palpitations   . Anxiety state, unspecified   . DIABETES MELLITUS, TYPE II   . HYPERTENSION   . DYSLIPIDEMIA     Review of Systems  Constitutional: Negative for fever and weight loss.  HENT: Positive for ear pain, sore throat, hoarse voice and postnasal drip. Negative for drooling and trouble swallowing.   Respiratory: Positive for cough. Negative for hemoptysis and shortness of breath.   Cardiovascular: Negative for chest pain and leg swelling.  Gastrointestinal: Negative for heartburn, abdominal pain and diarrhea.  Genitourinary: Negative for enuresis and dyspareunia.  Musculoskeletal: Positive for myalgias.  Skin: Negative for rash.  Neurological: Negative for headaches.  Hematological: Positive for environmental allergies.       Objective:   Physical Exam  BP 112/52  Pulse 91  Temp 98.5 F (36.9 C) (Oral)  Ht 5\' 6"  (1.676 m)  Wt 136 lb 6.4 oz (61.871 kg)  BMI 22.02 kg/m2  SpO2 96% Wt Readings from Last 3 Encounters:  02/13/12 136 lb 6.4 oz (61.871 kg)  06/04/11 133 lb (60.328 kg)  04/13/11 132 lb 6.4 oz (60.056 kg)   Constitutional: She is thin/fit; appears well-developed and well-nourished. No distress. HENT: red OP, no exudate - TMs clear without effusion or erythema - no sinus tenderness Neck: Normal range of motion.  Neck supple. No JVD present. No thyromegaly present.  Cardiovascular: Normal rate, regular rhythm and normal heart sounds.  No murmur heard. No BLE edema. Pulmonary/Chest: Effort normal and breath sounds normal. No respiratory distress. She has no wheezes.  Neurological: She is alert and oriented to person, place, and time. No cranial nerve deficit. Coordination normal.  Psychiatric: She is  pleasant with slightly anxious mood and affect. Her behavior is normal. Judgment and thought content normal.    Lab Results  Component Value Date   WBC 7.7 08/23/2011   HGB 13.1 08/23/2011   HCT 39.2 08/23/2011   PLT 252.0 08/23/2011   GLUCOSE 101* 08/23/2011   CHOL 154 08/23/2011   TRIG 52.0 08/23/2011   HDL 48.00 08/23/2011   LDLCALC 96 08/23/2011   ALT 24 08/23/2011   AST 21 08/23/2011   NA 140 08/23/2011   K 4.6 08/23/2011   CL 104 08/23/2011   CREATININE 0.5 08/23/2011   BUN 18 08/23/2011   CO2 30 08/23/2011   TSH 3.19 08/23/2011   HGBA1C 6.2 08/23/2011      Assessment & Plan:   Acute pharyngitis laryngitis Cough/chest congestion - ?bronchitis  symptoms ongoing > 1 week - Not improved with OTC symptomatic care -  Zpak - erx done Also refill prometh+cod syrup continue supportive symptomatic care as ongoing

## 2012-02-13 NOTE — Patient Instructions (Signed)
It was good to see you today. Zpak antibiotics + codeine cough syrup as needed Refill on xanax as discussed - but not to use at same time as codeine cough syrup  Your prescription(s) have been submitted to your pharmacy. Please take as directed and contact our office if you believe you are having problem(s) with the medication(s).

## 2012-02-24 ENCOUNTER — Ambulatory Visit (INDEPENDENT_AMBULATORY_CARE_PROVIDER_SITE_OTHER)
Admission: RE | Admit: 2012-02-24 | Discharge: 2012-02-24 | Disposition: A | Discharge status: Home / Self Care | Payer: Medicare Other | Source: Ambulatory Visit

## 2012-02-24 ENCOUNTER — Encounter: Payer: Self-pay | Admitting: Internal Medicine

## 2012-02-24 ENCOUNTER — Other Ambulatory Visit (INDEPENDENT_AMBULATORY_CARE_PROVIDER_SITE_OTHER): Payer: Medicare Other

## 2012-02-24 ENCOUNTER — Ambulatory Visit (INDEPENDENT_AMBULATORY_CARE_PROVIDER_SITE_OTHER): Payer: Medicare Other | Admitting: Internal Medicine

## 2012-02-24 VITALS — BP 130/72 | HR 73 | Temp 98.1°F | Ht 66.0 in | Wt 136.0 lb

## 2012-02-24 DIAGNOSIS — E119 Type 2 diabetes mellitus without complications: Secondary | ICD-10-CM

## 2012-02-24 DIAGNOSIS — I10 Essential (primary) hypertension: Secondary | ICD-10-CM

## 2012-02-24 DIAGNOSIS — N959 Unspecified menopausal and perimenopausal disorder: Secondary | ICD-10-CM

## 2012-02-24 DIAGNOSIS — N951 Menopausal and female climacteric states: Secondary | ICD-10-CM

## 2012-02-24 DIAGNOSIS — E785 Hyperlipidemia, unspecified: Secondary | ICD-10-CM | POA: Diagnosis not present

## 2012-02-24 DIAGNOSIS — R002 Palpitations: Secondary | ICD-10-CM | POA: Diagnosis not present

## 2012-02-24 DIAGNOSIS — R5383 Other fatigue: Secondary | ICD-10-CM

## 2012-02-24 DIAGNOSIS — R5381 Other malaise: Secondary | ICD-10-CM

## 2012-02-24 LAB — CBC WITH DIFFERENTIAL/PLATELET
Basophils Absolute: 0 10*3/uL (ref 0.0–0.1)
Basophils Relative: 0.3 % (ref 0.0–3.0)
Eosinophils Absolute: 0.1 10*3/uL (ref 0.0–0.7)
HCT: 40 % (ref 36.0–46.0)
Hemoglobin: 13.1 g/dL (ref 12.0–15.0)
Lymphs Abs: 2.3 10*3/uL (ref 0.7–4.0)
MCHC: 32.8 g/dL (ref 30.0–36.0)
MCV: 91.4 fl (ref 78.0–100.0)
Monocytes Absolute: 0.8 10*3/uL (ref 0.1–1.0)
Neutro Abs: 5.4 10*3/uL (ref 1.4–7.7)
RBC: 4.38 Mil/uL (ref 3.87–5.11)
RDW: 12.5 % (ref 11.5–14.6)

## 2012-02-24 LAB — BASIC METABOLIC PANEL
BUN: 16 mg/dL (ref 6–23)
Calcium: 9.4 mg/dL (ref 8.4–10.5)
Creatinine, Ser: 0.4 mg/dL (ref 0.4–1.2)

## 2012-02-24 LAB — HEPATIC FUNCTION PANEL
Alkaline Phosphatase: 86 U/L (ref 39–117)
Bilirubin, Direct: 0.1 mg/dL (ref 0.0–0.3)
Total Bilirubin: 0.5 mg/dL (ref 0.3–1.2)
Total Protein: 7.1 g/dL (ref 6.0–8.3)

## 2012-02-24 LAB — LIPID PANEL
Total CHOL/HDL Ratio: 4
Triglycerides: 128 mg/dL (ref 0.0–149.0)

## 2012-02-24 LAB — URINALYSIS, ROUTINE W REFLEX MICROSCOPIC
Bilirubin Urine: NEGATIVE
Ketones, ur: NEGATIVE
Leukocytes, UA: NEGATIVE
Urobilinogen, UA: 0.2 (ref 0.0–1.0)

## 2012-02-24 NOTE — Assessment & Plan Note (Signed)
On statin - pt expects labs to be checked every 3 months - continued to reassure she never has any problems and approp for annual testing,... but labs ordered today as requested  

## 2012-02-24 NOTE — Patient Instructions (Signed)
It was good to see you today. Medications reviewed, no changes at this time. EKG done today Test(s) ordered today. Your results will be called to you after review (48-72hours after test completion). If any changes need to be made, you will be notified at that time. we'll make referral for bone density testing . Our office will contact you regarding appointment(s) once made. Use Mucinex or Robitussin for cough symptoms  Cancel October visit Please schedule followup in 6 months, call sooner if problems.

## 2012-02-24 NOTE — Assessment & Plan Note (Signed)
Check ECG Long hx same The patient is reassured that these symptoms do not appear to represent a serious or threatening condition.

## 2012-02-24 NOTE — Assessment & Plan Note (Signed)
Strong FH - controls self with diet -  status post [facility] education with [facility] recheck a1c now Lab Results  Component Value Date   HGBA1C 6.2 08/23/2011

## 2012-02-24 NOTE — Assessment & Plan Note (Signed)
The current medical regimen is effective;  continue present plan and medications. BP Readings from Last 3 Encounters:  02/24/12 130/72  02/13/12 112/52  08/23/11 130/62

## 2012-02-24 NOTE — Progress Notes (Signed)
  Subjective:    Patient ID: Kristen Rivas, female    DOB: 04-03-1935, 77 y.o.   MRN: [id number]  HPI Here for follow up - reviewed chronic medical issues -   HTN - no longer taking beta-blocker (stopped 08/2009)- feels well controlled on azor, reports 100% med compliance - still feels fatigued but no chest pain, headache or edema or vision changes   palpitations - saw cards for same in early 08/2009 - no longer taking beta-blocker as did not feel beta-blocker making any difference in episodes - episodes still occur 1-2x/week but not in any particular pattern or trigger   anxiety - uses xanax as needed - sleeping ok- regular exercise which helps reduce stress -   DM2 - diet controlled - strong FH same - carefully monitors her diet and home cbgs  dyslipidemia - labs reviewed  - no problems with current meds - reduced dose simva 04/09/10 - very concerned re: possible adv side effects and wants to check labs every 3 months to monitor   Requests bone density screening  Past Medical History  Diagnosis Date  . CARDIAC ARRHYTHMIA   . CVA 07/2006    right thalamic CVA  . Palpitations   . Anxiety state, unspecified   . DIABETES MELLITUS, TYPE II   . HYPERTENSION   . DYSLIPIDEMIA     Review of Systems  Constitutional: Positive for fatigue. Negative for fever and unexpected weight change.  Respiratory: Positive for cough (chest congestion, residual). Negative for shortness of breath.   Cardiovascular: Negative for chest pain and leg swelling.  Gastrointestinal: Negative for abdominal pain.  Genitourinary: Negative for flank pain, enuresis and dyspareunia.       Objective:   Physical Exam  BP 130/72  Pulse 73  Temp 98.1 F (36.7 C) (Oral)  Ht 5\' 6"  (1.676 m)  Wt 136 lb (61.689 kg)  BMI 21.95 kg/m2  SpO2 98% Wt Readings from Last 3 Encounters:  02/24/12 136 lb (61.689 kg)  02/13/12 136 lb 6.4 oz (61.871 kg)  06/04/11 133 lb (60.328 kg)   Constitutional: She is thin/fit; appears  well-developed and well-nourished. No distress. Neck: Normal range of motion. Neck supple. No JVD present. No thyromegaly present.  Cardiovascular: Normal rate, regular rhythm and normal heart sounds.  No murmur heard. No BLE edema. Pulmonary/Chest: Effort normal and breath sounds normal. No respiratory distress. She has no wheezes.  Abdomen: Soft, nontender. No flank pain to palpation Neurological: She is alert and oriented to person, place, and time. No cranial nerve deficit. Coordination normal.  Psychiatric: She is pleasant with slightly anxious mood/affect. Her behavior is normal. Judgment and thought content normal.    Lab Results  Component Value Date   WBC 7.7 08/23/2011   HGB 13.1 08/23/2011   HCT 39.2 08/23/2011   PLT 252.0 08/23/2011   GLUCOSE 101* 08/23/2011   CHOL 154 08/23/2011   TRIG 52.0 08/23/2011   HDL 48.00 08/23/2011   LDLCALC 96 08/23/2011   ALT 24 08/23/2011   AST 21 08/23/2011   NA 140 08/23/2011   K 4.6 08/23/2011   CL 104 08/23/2011   CREATININE 0.5 08/23/2011   BUN 18 08/23/2011   CO2 30 08/23/2011   TSH 3.19 08/23/2011   HGBA1C 6.2 08/23/2011      Assessment & Plan:  Fatigue, mild nonspecific - check screening labs  post menopausal - check DEXA - last done 06/2011  See problem list. Medications and labs reviewed today.

## 2012-04-04 ENCOUNTER — Other Ambulatory Visit: Payer: Self-pay | Admitting: Internal Medicine

## 2012-04-04 DIAGNOSIS — Z1231 Encounter for screening mammogram for malignant neoplasm of breast: Secondary | ICD-10-CM

## 2012-04-27 DIAGNOSIS — E119 Type 2 diabetes mellitus without complications: Secondary | ICD-10-CM | POA: Diagnosis not present

## 2012-04-27 DIAGNOSIS — H2510 Age-related nuclear cataract, unspecified eye: Secondary | ICD-10-CM | POA: Diagnosis not present

## 2012-05-04 DIAGNOSIS — L820 Inflamed seborrheic keratosis: Secondary | ICD-10-CM | POA: Diagnosis not present

## 2012-05-05 DIAGNOSIS — Z23 Encounter for immunization: Secondary | ICD-10-CM | POA: Diagnosis not present

## 2012-05-07 ENCOUNTER — Encounter: Payer: Self-pay | Admitting: Internal Medicine

## 2012-05-12 ENCOUNTER — Other Ambulatory Visit: Payer: Self-pay | Admitting: Internal Medicine

## 2012-06-08 ENCOUNTER — Ambulatory Visit
Admission: RE | Admit: 2012-06-08 | Discharge: 2012-06-08 | Disposition: A | Discharge status: Home / Self Care | Payer: Medicare Other | Source: Ambulatory Visit | Attending: Internal Medicine | Admitting: Internal Medicine

## 2012-06-08 DIAGNOSIS — Z1231 Encounter for screening mammogram for malignant neoplasm of breast: Secondary | ICD-10-CM | POA: Diagnosis not present

## 2012-06-15 ENCOUNTER — Encounter: Payer: Self-pay | Admitting: Internal Medicine

## 2012-08-24 ENCOUNTER — Ambulatory Visit: Payer: Medicare Other | Admitting: Internal Medicine

## 2012-08-31 ENCOUNTER — Ambulatory Visit: Payer: Medicare Other | Admitting: Internal Medicine

## 2012-09-03 ENCOUNTER — Ambulatory Visit (INDEPENDENT_AMBULATORY_CARE_PROVIDER_SITE_OTHER): Payer: Medicare Other | Admitting: Internal Medicine

## 2012-09-03 ENCOUNTER — Other Ambulatory Visit (INDEPENDENT_AMBULATORY_CARE_PROVIDER_SITE_OTHER): Payer: Medicare Other

## 2012-09-03 ENCOUNTER — Encounter: Payer: Self-pay | Admitting: Internal Medicine

## 2012-09-03 VITALS — BP 138/62 | HR 79 | Temp 98.0°F | Wt 138.8 lb

## 2012-09-03 DIAGNOSIS — R5381 Other malaise: Secondary | ICD-10-CM | POA: Diagnosis not present

## 2012-09-03 DIAGNOSIS — E785 Hyperlipidemia, unspecified: Secondary | ICD-10-CM | POA: Diagnosis not present

## 2012-09-03 DIAGNOSIS — E119 Type 2 diabetes mellitus without complications: Secondary | ICD-10-CM | POA: Diagnosis not present

## 2012-09-03 DIAGNOSIS — I10 Essential (primary) hypertension: Secondary | ICD-10-CM | POA: Diagnosis not present

## 2012-09-03 DIAGNOSIS — R5383 Other fatigue: Secondary | ICD-10-CM | POA: Diagnosis not present

## 2012-09-03 LAB — CBC WITH DIFFERENTIAL/PLATELET
Basophils Relative: 0.5 % (ref 0.0–3.0)
Eosinophils Relative: 1.3 % (ref 0.0–5.0)
HCT: 38.1 % (ref 36.0–46.0)
Hemoglobin: 12.7 g/dL (ref 12.0–15.0)
Lymphs Abs: 1.8 10*3/uL (ref 0.7–4.0)
MCV: 89.4 fl (ref 78.0–100.0)
Monocytes Absolute: 0.5 10*3/uL (ref 0.1–1.0)
Neutro Abs: 4 10*3/uL (ref 1.4–7.7)
Neutrophils Relative %: 61.7 % (ref 43.0–77.0)
RBC: 4.26 Mil/uL (ref 3.87–5.11)
WBC: 6.4 10*3/uL (ref 4.5–10.5)

## 2012-09-03 LAB — TSH: TSH: 1.97 u[IU]/mL (ref 0.35–5.50)

## 2012-09-03 LAB — BASIC METABOLIC PANEL
BUN: 18 mg/dL (ref 6–23)
GFR: 127.76 mL/min (ref >60.00)
Potassium: 4.5 mEq/L (ref 3.5–5.1)

## 2012-09-03 LAB — HEPATIC FUNCTION PANEL
Bilirubin, Direct: 0.1 mg/dL (ref 0.0–0.3)
Total Bilirubin: 0.6 mg/dL (ref 0.3–1.2)

## 2012-09-03 LAB — LIPID PANEL
LDL Cholesterol: 98 mg/dL (ref 0–99)
Total CHOL/HDL Ratio: 4
VLDL: 16.6 mg/dL (ref 0.0–40.0)

## 2012-09-03 MED ORDER — ALPRAZOLAM 0.5 MG PO TABS: 0.5000 mg | ORAL_TABLET | Freq: Three times a day (TID) | ORAL | Status: DC | PRN

## 2012-09-03 NOTE — Patient Instructions (Signed)
It was good to see you today. Medications reviewed, no changes at this time. Test(s) ordered today. Your results will be released to MyChart (or called to you) after review, usually within 72hours after test completion. If any changes need to be made, you will be notified at that same time. Please schedule followup in 6 months, call sooner if problems.

## 2012-09-03 NOTE — Assessment & Plan Note (Signed)
Strong FH - controls self with diet -  status post nutrition education with maggie at [facility] recheck a1c now Lab Results  Component Value Date   HGBA1C 6.0 02/24/2012

## 2012-09-03 NOTE — Assessment & Plan Note (Signed)
On statin - pt expects labs to be checked every 3-6 months - continued to reassure she never has any problems and approp for annual testing,... but labs ordered today as requested 

## 2012-09-03 NOTE — Assessment & Plan Note (Signed)
The current medical regimen is effective;  continue present plan and medications. BP Readings from Last 3 Encounters:  09/03/12 138/62  02/24/12 130/72  02/13/12 112/52

## 2012-09-03 NOTE — Progress Notes (Signed)
  Subjective:    Patient ID: Kristen Rivas, female    DOB: 1935/06/09, 78 y.o.   MRN: [id number]  HPI  Here for follow up - reviewed chronic medical issues -   hypertension - no longer taking beta-blocker (stopped 08/2009)- feels well controlled on azor, reports 100% med compliance - still feels fatigued but no chest pain, headache or edema or vision changes   palpitations - saw cards for same in early 08/2009 - no longer taking beta-blocker as did not feel beta-blocker making any difference in episodes - episodes still occur 1-2x/week but not in any particular pattern or trigger   anxiety - uses xanax as needed - sleeping ok- regular exercise which helps reduce stress -   DM2 - diet controlled - strong FH same - carefully monitors her diet and home cbgs  dyslipidemia - labs reviewed  - no problems with current meds - reduced dose simva 04/09/10 - very concerned re: possible adv side effects and wants to check labs every 3-6 months to monitor    Past Medical History  Diagnosis Date  . CARDIAC ARRHYTHMIA   . CVA 07/2006    right thalamic CVA  . Palpitations   . Anxiety state, unspecified   . DIABETES MELLITUS, TYPE II   . HYPERTENSION   . DYSLIPIDEMIA     Review of Systems  Constitutional: Positive for fatigue. Negative for fever and unexpected weight change.  Respiratory: Negative for cough and shortness of breath.   Cardiovascular: Negative for chest pain and leg swelling.  Gastrointestinal: Negative for abdominal pain.  Genitourinary: Negative for flank pain, enuresis and dyspareunia.       Objective:   Physical Exam  BP 138/62  Pulse 79  Temp(Src) 98 F (36.7 C) (Oral)  Wt 138 lb 12.8 oz (62.959 kg)  BMI 22.41 kg/m2  SpO2 98% Wt Readings from Last 3 Encounters:  09/03/12 138 lb 12.8 oz (62.959 kg)  02/24/12 136 lb (61.689 kg)  02/13/12 136 lb 6.4 oz (61.871 kg)   Constitutional: She is thin/fit; appears well-developed and well-nourished. No distress. Neck: Normal  range of motion. Neck supple. No JVD present. No thyromegaly present.  Cardiovascular: Normal rate, regular rhythm and normal heart sounds.  No murmur heard. No BLE edema. Pulmonary/Chest: Effort normal and breath sounds normal. No respiratory distress. She has no wheezes.  Neurological: She is alert and oriented to person, place, and time. No cranial nerve deficit. Coordination normal.  Psychiatric: She is pleasant with slightly anxious mood/affect. Her behavior is normal. Judgment and thought content normal.    Lab Results  Component Value Date   WBC 8.7 02/24/2012   HGB 13.1 02/24/2012   HCT 40.0 02/24/2012   PLT 255.0 02/24/2012   GLUCOSE 97 02/24/2012   CHOL 168 02/24/2012   TRIG 128.0 02/24/2012   HDL 47.60 02/24/2012   LDLCALC 95 02/24/2012   ALT 27 02/24/2012   AST 24 02/24/2012   NA 142 02/24/2012   K 5.2* 02/24/2012   CL 106 02/24/2012   CREATININE 0.4 02/24/2012   BUN 16 02/24/2012   CO2 29 02/24/2012   TSH 2.44 02/24/2012   HGBA1C 6.0 02/24/2012      Assessment & Plan:  Fatigue, mild nonspecific - check screening labs  Also see problem list. Medications and labs reviewed today.

## 2012-11-01 ENCOUNTER — Other Ambulatory Visit: Payer: Medicare Other

## 2012-11-01 ENCOUNTER — Encounter: Payer: Self-pay | Admitting: Internal Medicine

## 2012-11-01 ENCOUNTER — Ambulatory Visit (INDEPENDENT_AMBULATORY_CARE_PROVIDER_SITE_OTHER): Payer: Medicare Other | Admitting: Internal Medicine

## 2012-11-01 VITALS — BP 132/60 | HR 83 | Temp 98.1°F | Wt 137.0 lb

## 2012-11-01 DIAGNOSIS — N390 Urinary tract infection, site not specified: Secondary | ICD-10-CM

## 2012-11-01 DIAGNOSIS — R350 Frequency of micturition: Secondary | ICD-10-CM | POA: Diagnosis not present

## 2012-11-01 DIAGNOSIS — R3915 Urgency of urination: Secondary | ICD-10-CM | POA: Diagnosis not present

## 2012-11-01 DIAGNOSIS — R300 Dysuria: Secondary | ICD-10-CM

## 2012-11-01 DIAGNOSIS — IMO0001 Reserved for inherently not codable concepts without codable children: Secondary | ICD-10-CM

## 2012-11-01 LAB — POCT URINALYSIS DIPSTICK
Bilirubin, UA: NEGATIVE
Leukocytes, UA: NEGATIVE
Nitrite, UA: POSITIVE
Protein, UA: NEGATIVE
pH, UA: 5

## 2012-11-01 MED ORDER — NITROFURANTOIN MONOHYD MACRO 100 MG PO CAPS: 100.0000 mg | ORAL_CAPSULE | Freq: Two times a day (BID) | ORAL | Status: DC

## 2012-11-01 NOTE — Progress Notes (Signed)
HPI  Pt presents to the clinic today with c/o urinary urgency. Frequency and dysuria. This started yesterday. She has not taken anything OTC for this. She has had UTI's in the past and she reports this feels the same. She denies fever, chills or back pain.   Review of Systems  Past Medical History  Diagnosis Date  . CARDIAC ARRHYTHMIA   . CVA 07/2006    right thalamic CVA  . Palpitations   . Anxiety state, unspecified   . DIABETES MELLITUS, TYPE II   . HYPERTENSION   . DYSLIPIDEMIA     Family History  Problem Relation Age of Onset  . Arthritis Mother   . Colon cancer Sister 38  . Lung cancer Sister   . Heart disease Brother   . Lung cancer Brother   . Diabetes Other     diabetes  . Arthritis Other   . Stroke Other     parent & grandparent    History   Social History  . Marital Status: Divorced    Spouse Name: N/A    Number of Children: N/A  . Years of Education: N/A   Occupational History  . Not on file.   Social History Main Topics  . Smoking status: Never Smoker   . Smokeless tobacco: Not on file     Comment: Divorced, lives alone. Retired  . Alcohol Use: No  . Drug Use: No  . Sexually Active: Not on file   Other Topics Concern  . Not on file   Social History Narrative  . No narrative on file    Allergies  Allergen Reactions  . Penicillins   . Terramycin     Constitutional: Denies fever, malaise, fatigue, headache or abrupt weight changes.   GU: Pt reports urgency, frequency and pain with urination. Denies burning sensation, blood in urine, odor or discharge. Skin: Denies redness, rashes, lesions or ulcercations.   No other specific complaints in a complete review of systems (except as listed in HPI above).    Objective:   Physical Exam  BP 132/60  Pulse 83  Temp(Src) 98.1 F (36.7 C) (Oral)  Wt 137 lb (62.143 kg)  BMI 22.12 kg/m2  SpO2 96% Wt Readings from Last 3 Encounters:  11/01/12 137 lb (62.143 kg)  09/03/12 138 lb 12.8 oz  (62.959 kg)  02/24/12 136 lb (61.689 kg)    General: Appears her stated age, well developed, well nourished in NAD. Cardiovascular: Normal rate and rhythm. S1,S2 noted.  No murmur, rubs or gallops noted. No JVD or BLE edema. No carotid bruits noted. Pulmonary/Chest: Normal effort and positive vesicular breath sounds. No respiratory distress. No wheezes, rales or ronchi noted.  Abdomen: Soft and nontender. Normal bowel sounds, no bruits noted. No distention or masses noted. Liver, spleen and kidneys non palpable. Tender to palpation over the bladder area. No CVA tenderness.      Assessment & Plan:   Urgency, Frequency and Dysuria secondary to UTI:  Urinalysis and urine culture eRx sent if for Macrobid 100 mg BID x 5 days eRx sent in for Pyridium 200 mg TID prn Drink plenty of fluids  RTC as needed or if symptoms persist.

## 2012-11-01 NOTE — Patient Instructions (Signed)
Urinary Tract Infection Urinary tract infections (UTIs) can develop anywhere along your urinary tract. Your urinary tract is your body's drainage system for removing wastes and extra water. Your urinary tract includes two kidneys, two ureters, a bladder, and a urethra. Your kidneys are a pair of bean-shaped organs. Each kidney is about the size of your fist. They are located below your ribs, one on each side of your spine. CAUSES Infections are caused by microbes, which are microscopic organisms, including fungi, viruses, and bacteria. These organisms are so small that they can only be seen through a microscope. Bacteria are the microbes that most commonly cause UTIs. SYMPTOMS  Symptoms of UTIs may vary by age and gender of the patient and by the location of the infection. Symptoms in young women typically include a frequent and intense urge to urinate and a painful, burning feeling in the bladder or urethra during urination. Older women and men are more likely to be tired, shaky, and weak and have muscle aches and abdominal pain. A fever may mean the infection is in your kidneys. Other symptoms of a kidney infection include pain in your back or sides below the ribs, nausea, and vomiting. DIAGNOSIS To diagnose a UTI, your caregiver will ask you about your symptoms. Your caregiver also will ask to provide a urine sample. The urine sample will be tested for bacteria and white blood cells. White blood cells are made by your body to help fight infection. TREATMENT  Typically, UTIs can be treated with medication. Because most UTIs are caused by a bacterial infection, they usually can be treated with the use of antibiotics. The choice of antibiotic and length of treatment depend on your symptoms and the type of bacteria causing your infection. HOME CARE INSTRUCTIONS  If you were prescribed antibiotics, take them exactly as your caregiver instructs you. Finish the medication even if you feel better after you  have only taken some of the medication.  Drink enough water and fluids to keep your urine clear or pale yellow.  Avoid caffeine, tea, and carbonated beverages. They tend to irritate your bladder.  Empty your bladder often. Avoid holding urine for long periods of time.  Empty your bladder before and after sexual intercourse.  After a bowel movement, women should cleanse from front to back. Use each tissue only once. SEEK MEDICAL CARE IF:   You have back pain.  You develop a fever.  Your symptoms do not begin to resolve within 3 days. SEEK IMMEDIATE MEDICAL CARE IF:   You have severe back pain or lower abdominal pain.  You develop chills.  You have nausea or vomiting.  You have continued burning or discomfort with urination. MAKE SURE YOU:   Understand these instructions.  Will watch your condition.  Will get help right away if you are not doing well or get worse. Document Released: 04/06/2005 Document Revised: 12/27/2011 Document Reviewed: 08/05/2011 [employer] Patient Information 2013 [employer]  

## 2012-11-03 LAB — URINE CULTURE: Colony Count: 6000

## 2012-11-08 DIAGNOSIS — L820 Inflamed seborrheic keratosis: Secondary | ICD-10-CM | POA: Diagnosis not present

## 2012-11-08 DIAGNOSIS — L609 Nail disorder, unspecified: Secondary | ICD-10-CM | POA: Diagnosis not present

## 2012-11-12 ENCOUNTER — Emergency Department (HOSPITAL_COMMUNITY)
Admission: EM | Admit: 2012-11-12 | Discharge: 2012-11-13 | Disposition: A | Discharge status: Home / Self Care | Payer: Medicare Other | Attending: Emergency Medicine | Admitting: Emergency Medicine

## 2012-11-12 ENCOUNTER — Encounter (HOSPITAL_COMMUNITY): Payer: Self-pay | Admitting: *Deleted

## 2012-11-12 ENCOUNTER — Other Ambulatory Visit: Payer: Self-pay | Admitting: Internal Medicine

## 2012-11-12 DIAGNOSIS — E785 Hyperlipidemia, unspecified: Secondary | ICD-10-CM | POA: Diagnosis not present

## 2012-11-12 DIAGNOSIS — Z8673 Personal history of transient ischemic attack (TIA), and cerebral infarction without residual deficits: Secondary | ICD-10-CM | POA: Insufficient documentation

## 2012-11-12 DIAGNOSIS — I10 Essential (primary) hypertension: Secondary | ICD-10-CM | POA: Insufficient documentation

## 2012-11-12 DIAGNOSIS — Z7902 Long term (current) use of antithrombotics/antiplatelets: Secondary | ICD-10-CM | POA: Diagnosis not present

## 2012-11-12 DIAGNOSIS — Z8679 Personal history of other diseases of the circulatory system: Secondary | ICD-10-CM | POA: Diagnosis not present

## 2012-11-12 DIAGNOSIS — Z79899 Other long term (current) drug therapy: Secondary | ICD-10-CM | POA: Insufficient documentation

## 2012-11-12 DIAGNOSIS — E119 Type 2 diabetes mellitus without complications: Secondary | ICD-10-CM | POA: Insufficient documentation

## 2012-11-12 DIAGNOSIS — R350 Frequency of micturition: Secondary | ICD-10-CM | POA: Diagnosis not present

## 2012-11-12 DIAGNOSIS — Z880 Allergy status to penicillin: Secondary | ICD-10-CM | POA: Insufficient documentation

## 2012-11-12 DIAGNOSIS — N390 Urinary tract infection, site not specified: Secondary | ICD-10-CM | POA: Insufficient documentation

## 2012-11-12 DIAGNOSIS — F411 Generalized anxiety disorder: Secondary | ICD-10-CM | POA: Diagnosis not present

## 2012-11-12 LAB — URINALYSIS, ROUTINE W REFLEX MICROSCOPIC
Glucose, UA: NEGATIVE mg/dL
Ketones, ur: NEGATIVE mg/dL
Urobilinogen, UA: 0.2 mg/dL (ref 0.0–1.0)

## 2012-11-12 MED ORDER — CEFTRIAXONE SODIUM 1 G IJ SOLR
1.0000 g | Freq: Once | INTRAMUSCULAR | Status: AC
  Administered 2012-11-12: 1 g via INTRAMUSCULAR
  Filled 2012-11-12: qty 10

## 2012-11-12 MED ORDER — PHENAZOPYRIDINE HCL 100 MG PO TABS
200.0000 mg | ORAL_TABLET | Freq: Once | ORAL | Status: AC
  Administered 2012-11-12: 200 mg via ORAL
  Filled 2012-11-12: qty 2

## 2012-11-12 NOTE — ED Notes (Signed)
Recent treatment with antibiotic for UTI,  Today began having dysuria again.  No NVD.

## 2012-11-13 MED ORDER — CEPHALEXIN 500 MG PO CAPS: 500.0000 mg | ORAL_CAPSULE | Freq: Four times a day (QID) | ORAL | Status: DC

## 2012-11-13 MED ORDER — PHENAZOPYRIDINE HCL 200 MG PO TABS: 200.0000 mg | ORAL_TABLET | Freq: Three times a day (TID) | ORAL | Status: DC

## 2012-11-13 NOTE — ED Provider Notes (Signed)
History     CSN: [id number]  Arrival date & time 11/12/12  2047   First MD Initiated Contact with Patient 11/12/12 2311      Chief Complaint  Patient presents with  . Dysuria    (Consider location/radiation/quality/duration/timing/severity/associated sxs/prior treatment) The history is provided by the patient.   the patient finished a 5 day course of Macrobid on Friday.  She now presents with 24 hours of dysuria and urinary frequency.  No fevers or chills.  No nausea or vomiting.  No flank pain.  No weakness or altered mental status.  She is a healthy 78 year old and very functional.  She feels like the Macrobid it helped as she had a approximately 36 hours without symptoms.  Her symptoms began last night.  She states she has recurring urinary tract infections one to 2 times per year.  She has for 40 years.  She states this is typical for her UTIs.  Nothing worsens or improves her symptoms.  Symptoms are mild in severity at this time.  Past Medical History  Diagnosis Date  . CARDIAC ARRHYTHMIA   . CVA 07/2006    right thalamic CVA  . Palpitations   . Anxiety state, unspecified   . DIABETES MELLITUS, TYPE II   . HYPERTENSION   . DYSLIPIDEMIA     Past Surgical History  Procedure Laterality Date  . Abdominal hysterectomy  1974  . Cholecystectomy  1981  . Back surgery      4-5 lumbar inner body fusion  . Hemorrhoid surgery      x's 2 1961 & 1964  . Breast enhancement surgery      Family History  Problem Relation Age of Onset  . Arthritis Mother   . Colon cancer Sister 69  . Lung cancer Sister   . Heart disease Brother   . Lung cancer Brother   . Diabetes Other     diabetes  . Arthritis Other   . Stroke Other     parent & grandparent    History  Substance Use Topics  . Smoking status: Never Smoker   . Smokeless tobacco: Not on file     Comment: Divorced, lives alone. Retired  . Alcohol Use: No    OB History   Grav Para Term Preterm Abortions TAB SAB Ect Mult  Living                  Review of Systems  Genitourinary: Positive for dysuria.  All other systems reviewed and are negative.    Allergies  Penicillins and Terramycin  Home Medications   Current Outpatient Rx  Name  Route  Sig  Dispense  Refill  . amLODipine-olmesartan (AZOR) 5-40 MG per tablet   Oral   Take 1 tablet by mouth every morning.         [name] ascorbic acid (VITAMIN C) 500 MG tablet   Oral   Take 500 mg by mouth 2 (two) times daily.          . Calcium Carbonate-Vitamin D (CALCIUM-VITAMIN D) 500-200 MG-UNIT per tablet   Oral   Take 1 tablet by mouth 2 (two) times daily with a meal.           . clopidogrel (PLAVIX) 75 MG tablet   Oral   Take 75 mg by mouth at bedtime.         . nitrofurantoin, macrocrystal-monohydrate, (MACROBID) 100 MG capsule   Oral   Take 1 capsule (100 mg total) by mouth  2 (two) times daily.   10 capsule   0   . Phenazopyridine HCl (PYRIDIUM PO)   Oral   Take 1 tablet by mouth once as needed (for urinary pain relief).         . simvastatin (ZOCOR) 10 MG tablet   Oral   Take 0.5 tablets (5 mg total) by mouth at bedtime.   90 tablet   2   . ALPRAZolam (XANAX) 0.5 MG tablet   Oral   Take 0.25-0.5 mg by mouth 3 (three) times daily as needed for sleep or anxiety.         . cephALEXin (KEFLEX) 500 MG capsule   Oral   Take 1 capsule (500 mg total) by mouth 4 (four) times daily.   28 capsule   0   . phenazopyridine (PYRIDIUM) 200 MG tablet   Oral   Take 1 tablet (200 mg total) by mouth 3 (three) times daily.   6 tablet   0     BP 132/63  Pulse 91  Temp(Src) 97.2 F (36.2 C) (Oral)  Resp 18  Ht 5\' 6"  (1.676 m)  Wt 137 lb (62.143 kg)  BMI 22.12 kg/m2  SpO2 95%  Physical Exam  Nursing note and vitals reviewed. Constitutional: She is oriented to person, place, and time. She appears well-developed and well-nourished. No distress.  HENT:  Head: Normocephalic and atraumatic.  Eyes: EOM are normal.  Neck: Normal  range of motion.  Cardiovascular: Normal rate, regular rhythm and normal heart sounds.   Pulmonary/Chest: Effort normal and breath sounds normal.  Abdominal: Soft. She exhibits no distension. There is no tenderness.  Musculoskeletal: Normal range of motion.  Neurological: She is alert and oriented to person, place, and time.  Skin: Skin is warm and dry.  Psychiatric: She has a normal mood and affect. Judgment normal.    ED Course  Procedures (including critical care time)  Labs Reviewed  URINALYSIS, ROUTINE W REFLEX MICROSCOPIC - Abnormal; Notable for the following:    Hgb urine dipstick TRACE (*)    Protein, ur TRACE (*)    Nitrite POSITIVE (*)    Leukocytes, UA MODERATE (*)    All other components within normal limits  URINE MICROSCOPIC-ADD ON - Abnormal; Notable for the following:    Bacteria, UA FEW (*)    All other components within normal limits  GLUCOSE, CAPILLARY - Abnormal; Notable for the following:    Glucose-Capillary 116 (*)    All other components within normal limits  URINE CULTURE   No results found.   1. Urinary tract infection       MDM  Uncomplicated urinary tract infection.  No access to the  urine culture.  This was done the primary care physician's office.  It's unclear as to whether or not she failed outpatient treatment her is whether or not she's developed a recurrent/secondary urinary tract infection.  Rocephin in the emergency department.  Keflex.  PCP followup.  She understands return to ER for new or worsening symptoms.  Doubt renal abscess.  Doubt infected ureteral stone or other complicated factor this time.        Lyanne Co, MD 11/13/12 [phone]

## 2012-11-15 LAB — URINE CULTURE: Colony Count: 25000

## 2012-11-16 ENCOUNTER — Encounter: Payer: Self-pay | Admitting: Internal Medicine

## 2012-11-16 ENCOUNTER — Telehealth (HOSPITAL_COMMUNITY): Payer: Self-pay | Admitting: Emergency Medicine

## 2012-11-16 ENCOUNTER — Ambulatory Visit (INDEPENDENT_AMBULATORY_CARE_PROVIDER_SITE_OTHER): Payer: Medicare Other | Admitting: Internal Medicine

## 2012-11-16 VITALS — BP 132/60 | HR 80 | Temp 98.0°F | Wt 138.8 lb

## 2012-11-16 DIAGNOSIS — N390 Urinary tract infection, site not specified: Secondary | ICD-10-CM | POA: Diagnosis not present

## 2012-11-16 DIAGNOSIS — N3090 Cystitis, unspecified without hematuria: Secondary | ICD-10-CM

## 2012-11-16 LAB — POCT URINALYSIS DIPSTICK
Bilirubin, UA: NEGATIVE
Blood, UA: NEGATIVE
Glucose, UA: NEGATIVE
Spec Grav, UA: <=1.005

## 2012-11-16 MED ORDER — FLUCONAZOLE 150 MG PO TABS: 150.0000 mg | ORAL_TABLET | Freq: Once | ORAL | Status: DC

## 2012-11-16 MED ORDER — NITROFURANTOIN MONOHYD MACRO 100 MG PO CAPS: 100.0000 mg | ORAL_CAPSULE | Freq: Two times a day (BID) | ORAL | Status: DC

## 2012-11-16 NOTE — Progress Notes (Addendum)
ED Antimicrobial Stewardship Positive Culture Follow Up   Kristen Rivas is an 78 y.o. female who presented to [facility] on 11/12/2012 with a chief complaint of  Chief Complaint  Patient presents with  . Dysuria    Recent Results (from the past 720 hour(s))  URINE CULTURE     Status: None   Collection Time    11/01/12  5:13 PM      Result Value Range Status   Colony Count 6,000 COLONIES/ML   Final   Organism ID, Bacteria Insignificant Growth   Final  URINE CULTURE     Status: None   Collection Time    11/12/12  9:00 PM      Result Value Range Status   Specimen Description URINE, CLEAN CATCH   Final   Special Requests NONE   Final   Culture  Setup Time 11/12/2012 21:30   Final   Colony Count 25,000 COLONIES/ML   Final   Culture ESCHERICHIA COLI   Final   Report Status 11/15/2012 FINAL   Final   Organism ID, Bacteria ESCHERICHIA COLI   Final    [x]  Treated with cephalexin, organism resistant to prescribed antimicrobial []  Patient discharged originally without antimicrobial agent and treatment is now indicated  New antibiotic prescription: cipro 250mg  po BID x 3 days  ED Provider: Junious Silk, PA-C   Kristen Rivas 11/16/2012, 3:14 PM Infectious Diseases Pharmacist Phone# [phone]

## 2012-11-16 NOTE — Progress Notes (Signed)
Subjective:    Patient ID: Kristen Rivas, female    DOB: Jun 26, 1935, 78 y.o.   MRN: [id number]  HPI  Patient presents to the office for f/u after visiting [facility] on Tuesday 5/6 for a UTI.  Patient was treated in the ER with a shot of Rocephin, and was given Keflex 500 mg QID.  Patient states that she is doing better after her ER visit, but she has had a significant amount of diarrhea since.  Patient has not done anything for the diarrhea at home other than drinking a lot of fluids.  Patient was seen for same by Nicki Reaper on 11/01/12.  She was treated at that time with Macrobid 100 mg BID x 5 days.  Patient has seen a urologist 30 years ago but hasn't seen one since.   Past Medical History  Diagnosis Date  . CARDIAC ARRHYTHMIA   . CVA 07/2006    right thalamic CVA  . Palpitations   . Anxiety state, unspecified   . DIABETES MELLITUS, TYPE II   . HYPERTENSION   . DYSLIPIDEMIA    Family History  Problem Relation Age of Onset  . Arthritis Mother   . Colon cancer Sister 65  . Lung cancer Sister   . Heart disease Brother   . Lung cancer Brother   . Diabetes Other     diabetes  . Arthritis Other   . Stroke Other     parent & grandparent         Review of Systems  Constitutional: Positive for fatigue. Negative for fever and chills.  Gastrointestinal: Positive for abdominal pain and diarrhea. Negative for nausea and vomiting.  Genitourinary: Positive for frequency. Negative for dysuria, urgency, hematuria and difficulty urinating.  All other systems reviewed and are negative.       Objective:   Physical Exam  Nursing note and vitals reviewed. Constitutional: She is oriented to person, place, and time. She appears well-developed and well-nourished. No distress.  HENT:  Head: Normocephalic and atraumatic.  Eyes: Conjunctivae are normal. No scleral icterus.  Neck: Normal range of motion. Neck supple.  Cardiovascular: Normal rate, regular rhythm and normal heart sounds.   Exam reveals no gallop and no friction rub.   No murmur heard. Pulmonary/Chest: Effort normal and breath sounds normal. No respiratory distress. She has no wheezes. She has no rales. She exhibits no tenderness.  Abdominal: Soft. Bowel sounds are normal. She exhibits no distension and no mass. There is no tenderness. There is no rebound and no guarding.  Neurological: She is alert and oriented to person, place, and time.  Skin: Skin is warm and dry.  Psychiatric: She has a normal mood and affect. Her behavior is normal.   Lab Results  Component Value Date   WBC 6.4 09/03/2012   HGB 12.7 09/03/2012   HCT 38.1 09/03/2012   PLT 230.0 09/03/2012   GLUCOSE 88 09/03/2012   CHOL 155 09/03/2012   TRIG 83.0 09/03/2012   HDL 40.10 09/03/2012   LDLCALC 98 09/03/2012   ALT 20 09/03/2012   AST 20 09/03/2012   NA 141 09/03/2012   K 4.5 09/03/2012   CL 106 09/03/2012   CREATININE 0.5 09/03/2012   BUN 18 09/03/2012   CO2 29 09/03/2012   TSH 1.97 09/03/2012   HGBA1C 6.1 09/03/2012          Assessment & Plan:  Patient presents to the office today for f/u after being in the ER.  1.  Simple cystitis:  We will stop Keflex today based on the sensitivity and culture done at [facility].  We will start Macrobid 100 mg PO BID x 10 days.  Based on the recurrent nature and our inability to grow bacteria we will refer to Urology for follow up.   2.  Patient prescribed Diflucan 150 mg for yeast infection caused by antibiotic.     I have personally reviewed this case with PA student. I also personally examined this patient. I agree with history and findings as documented above. I reviewed, discussed and approve of the assessment and plan as listed above. Rene Paci, MD

## 2012-11-16 NOTE — Patient Instructions (Signed)
It was good to see you today. We have reviewed your prior records including labs and tests today Stop Keflex and resume Macrobid twice daily for the next 10 days Use Diflucan as needed for yeast infection symptoms Your prescription(s) have been submitted to your pharmacy. Please take as directed and contact our office if you believe you are having problem(s) with the medication(s). Will refer for evaluation by urology for recurrent cystitis symptoms - our office will call regarding this appointment once made Followup as scheduled, call sooner if problems  Interstitial Cystitis Interstitial cystitis (IC) is a condition that results in discomfort or pain in the bladder and the surrounding pelvic region. The symptoms can be different from case to case and even in the same individual. People may experience:  Mild discomfort.  Pressure.  Tenderness.  Intense pain in the bladder and pelvic area. CAUSES  Because IC varies so much in symptoms and severity, people studying this disease believe it is not one but several diseases. Some caregivers use the term painful bladder syndrome (PBS) to describe cases with painful urinary symptoms. This may not meet the strictest definition of IC. The term IC / PBS includes all cases of urinary pain that cannot be connected to other causes, such as infection or urinary stones.  SYMPTOMS  Symptoms may include:  An urgent need to urinate.  A frequent need to urinate.  A combination of these symptoms. Pain may change in intensity as the bladder fills with urine or as it empties. Women's symptoms often get worse during menstruation. They may sometimes experience pain with vaginal intercourse. Some of the symptoms of IC / PBS seem like those of bacterial infection. Tests do not show infection. IC / PBS is far more common in women than in men.  DIAGNOSIS  The diagnosis of IC / PBS is based on:  Presence of pain related to the bladder, usually along with problems  of frequency and urgency.  Not finding other diseases that could cause the symptoms.  Diagnostic tests that help rule out other diseases include:  Urinalysis.  Urine culture.  Cystoscopy.  Biopsy of the bladder wall.  Distension of the bladder under anesthesia.  Urine cytology.  Laboratory examination of prostate secretions. A biopsy is a tissue sample that can be looked at under a microscope. Samples of the bladder and urethra may be removed during a cystoscopy. A biopsy helps rule out bladder cancer. TREATMENT  Scientists have not yet found a cure for IC / PBS. Patients with IC / PBS do not get better with antibiotic therapy. Caregivers cannot predict who will respond best to which treatment. Symptoms may disappear without explanation. Disappearing symptoms may coincide with an event such as a change in diet or treatment. Even when symptoms disappear, they may return after days, weeks, months, or years.  Because the causes of IC / PBS are unknown, current treatments are aimed at relieving symptoms. Many people are helped by one or a combination of the treatments. As researchers learn more about IC / PBS, the list of potential treatments will change. Patients should discuss their options with a caregiver. SURGERY  Surgery should be considered only if all available treatments have failed and the pain is disabling. Many approaches and techniques are used. Each approach has its own advantages and complications. Advantages and complications should be discussed with a urologist. Your caregiver may recommend consulting another urologist for a second opinion. Most caregivers are reluctant to operate because the outcome is unpredictable. Some  people still have symptoms after surgery.  People considering surgery should discuss the potential risks and benefits, side effects, and long- and short-term complications with their family, as well as with people who have already had the procedure. Surgery  requires anesthesia, hospitalization, and in some cases weeks or months of recovery. As the complexity of the procedure increases, so do the chances for complications and for failure. HOME CARE INSTRUCTIONS   All drugs, even those sold over the counter, have side effects. Patients should always consult a caregiver before using any drug for an extended amount of time. Only take over-the-counter or prescription medicines for pain, discomfort, or fever as directed by your caregiver.  Many patients feel that smoking makes their symptoms worse. How the by-products of tobacco that are excreted in the urine affect IC / PBS is unknown. Smoking is the major known cause of bladder cancer. One of the best things smokers can do for their bladder and their overall health is to quit.  Many patients feel that gentle stretching exercises help relieve IC / PBS symptoms.  Methods vary, but basically patients decide to empty their bladder at designated times and use relaxation techniques and distractions to keep to the schedule. Gradually, patients try to lengthen the time between scheduled voids. A diary in which to record voiding times is usually helpful in keeping track of progress. MAKE SURE YOU:   Understand these instructions.  Will watch your condition.  Will get help right away if you are not doing well or get worse. Document Released: 02/26/2004 Document Revised: 09/19/2011 Document Reviewed: 05/12/2008 [facility] Patient Information 2013 [place], [place].

## 2012-11-16 NOTE — ED Notes (Signed)
Post ED Visit - Positive Culture Follow-up: Successful Patient Follow-Up  Culture assessed and recommendations reviewed by: []  Wes Dulaney, Pharm.D., BCPS [x]  Celedonio Miyamoto, Pharm.D., BCPS []  Georgina Pillion, [address].D., BCPS []  Gardner, [address].D., BCPS, AAHIVP []  Estella Husk, Pharm.D., BCPS, AAHIVP  Positive urine culture  []  Patient discharged without antimicrobial prescription and treatment is now indicated [x]  Organism is resistant to prescribed ED discharge antimicrobial []  Patient with positive blood cultures  Changes discussed with ED provider: Junious Silk PA-C New antibiotic prescription Cipro 250 mg PO BID x 3 days #6    Kylie A Holland 11/16/2012, 2:26 PM

## 2012-11-16 NOTE — Progress Notes (Signed)
  Subjective:    Patient ID: Kristen Rivas, female    DOB: 1934/11/01, 78 y.o.   MRN: [id number]  HPI  See CC and PA student note  Past Medical History  Diagnosis Date  . CARDIAC ARRHYTHMIA   . CVA 07/2006    right thalamic CVA  . Palpitations   . Anxiety state, unspecified   . DIABETES MELLITUS, TYPE II   . HYPERTENSION   . DYSLIPIDEMIA     Review of Systems  Constitutional: Negative for fever and fatigue.  Respiratory: Negative for cough and shortness of breath.   Gastrointestinal: Positive for diarrhea. Negative for nausea, vomiting and constipation.  Genitourinary: Positive for dysuria, urgency, frequency and difficulty urinating.  Skin: Negative for rash and wound.       Objective:   Physical Exam BP 132/60  Pulse 80  Temp(Src) 98 F (36.7 C) (Oral)  Wt 138 lb 12.8 oz (62.959 kg)  BMI 22.41 kg/m2  SpO2 96% Constitutional: She appears well-developed and well-nourished. No distress.  Neck: Normal range of motion. Neck supple. No JVD present. No thyromegaly present.  Cardiovascular: Normal rate, regular rhythm and normal heart sounds.  No murmur heard. No BLE edema. Pulmonary/Chest: Effort normal and breath sounds normal. No respiratory distress. She has no wheezes.  Abdominal: Soft. Bowel sounds are normal. She exhibits no distension. There is no tenderness. no masses Psychiatric: She has a normal mood and affect. Her behavior is normal. Judgment and thought content normal.   Lab Results  Component Value Date   WBC 6.4 09/03/2012   HGB 12.7 09/03/2012   HCT 38.1 09/03/2012   PLT 230.0 09/03/2012   GLUCOSE 88 09/03/2012   CHOL 155 09/03/2012   TRIG 83.0 09/03/2012   HDL 40.10 09/03/2012   LDLCALC 98 09/03/2012   ALT 20 09/03/2012   AST 20 09/03/2012   NA 141 09/03/2012   K 4.5 09/03/2012   CL 106 09/03/2012   CREATININE 0.5 09/03/2012   BUN 18 09/03/2012   CO2 29 09/03/2012   TSH 1.97 09/03/2012   HGBA1C 6.1 09/03/2012   POC Udip unremarkable     Assessment & Plan:    Cystitis - chronic and recurrent Change antibiotics to macrobid per Ucx sensitivities from [facility] 5/6 visit - macrobid x 10d Refer to urology because of frequency of recurrence

## 2012-12-18 ENCOUNTER — Ambulatory Visit (INDEPENDENT_AMBULATORY_CARE_PROVIDER_SITE_OTHER): Payer: Medicare Other | Admitting: Urology

## 2012-12-18 DIAGNOSIS — N952 Postmenopausal atrophic vaginitis: Secondary | ICD-10-CM | POA: Diagnosis not present

## 2012-12-18 DIAGNOSIS — N390 Urinary tract infection, site not specified: Secondary | ICD-10-CM | POA: Diagnosis not present

## 2012-12-18 DIAGNOSIS — N3000 Acute cystitis without hematuria: Secondary | ICD-10-CM

## 2013-01-03 ENCOUNTER — Other Ambulatory Visit: Payer: Self-pay | Admitting: Internal Medicine

## 2013-03-08 ENCOUNTER — Ambulatory Visit (INDEPENDENT_AMBULATORY_CARE_PROVIDER_SITE_OTHER): Payer: Medicare Other | Admitting: Internal Medicine

## 2013-03-08 ENCOUNTER — Other Ambulatory Visit: Payer: Self-pay | Admitting: Internal Medicine

## 2013-03-08 ENCOUNTER — Other Ambulatory Visit (INDEPENDENT_AMBULATORY_CARE_PROVIDER_SITE_OTHER): Payer: Medicare Other

## 2013-03-08 ENCOUNTER — Encounter: Payer: Self-pay | Admitting: Internal Medicine

## 2013-03-08 VITALS — BP 130/72 | HR 80 | Temp 97.5°F | Wt 138.0 lb

## 2013-03-08 DIAGNOSIS — N390 Urinary tract infection, site not specified: Secondary | ICD-10-CM | POA: Diagnosis not present

## 2013-03-08 DIAGNOSIS — Z0000 Encounter for general adult medical examination without abnormal findings: Secondary | ICD-10-CM | POA: Diagnosis not present

## 2013-03-08 DIAGNOSIS — E785 Hyperlipidemia, unspecified: Secondary | ICD-10-CM

## 2013-03-08 DIAGNOSIS — E119 Type 2 diabetes mellitus without complications: Secondary | ICD-10-CM | POA: Diagnosis not present

## 2013-03-08 DIAGNOSIS — I10 Essential (primary) hypertension: Secondary | ICD-10-CM

## 2013-03-08 DIAGNOSIS — Z23 Encounter for immunization: Secondary | ICD-10-CM | POA: Diagnosis not present

## 2013-03-08 LAB — HEMOGLOBIN A1C: Hgb A1c MFr Bld: 6.1 % (ref 4.6–6.5)

## 2013-03-08 LAB — BASIC METABOLIC PANEL
BUN: 16 mg/dL (ref 6–23)
Calcium: 9.4 mg/dL (ref 8.4–10.5)
Creatinine, Ser: 0.5 mg/dL (ref 0.4–1.2)
GFR: 119.28 mL/min (ref >60.00)
Glucose, Bld: 95 mg/dL (ref 70–99)
Potassium: 4.3 mEq/L (ref 3.5–5.1)

## 2013-03-08 LAB — POCT URINALYSIS DIPSTICK
Bilirubin, UA: NEGATIVE
Glucose, UA: NEGATIVE
Ketones, UA: NEGATIVE
Spec Grav, UA: <=1.005

## 2013-03-08 LAB — LIPID PANEL
Cholesterol: 159 mg/dL (ref 0–200)
HDL: 42.8 mg/dL (ref >39.00)
Triglycerides: 112 mg/dL (ref 0.0–149.0)
VLDL: 22.4 mg/dL (ref 0.0–40.0)

## 2013-03-08 MED ORDER — CIPROFLOXACIN HCL 250 MG PO TABS: 250.0000 mg | ORAL_TABLET | Freq: Two times a day (BID) | ORAL | Status: AC

## 2013-03-08 NOTE — Assessment & Plan Note (Signed)
On statin - pt expects labs to be checked every 3-6 months - continued to reassure she never has any problems and approp for annual testing,... but labs ordered today as requested 

## 2013-03-08 NOTE — Assessment & Plan Note (Signed)
The current medical regimen is effective;  continue present plan and medications. BP Readings from Last 3 Encounters:  03/08/13 130/72  11/16/12 132/60  11/12/12 132/63

## 2013-03-08 NOTE — Assessment & Plan Note (Signed)
Strong FH - controls self with diet -  status post nutrition education with maggie at [facility]mt center recheck a1c now Lab Results  Component Value Date   HGBA1C 6.1 09/03/2012

## 2013-03-08 NOTE — Progress Notes (Signed)
Subjective:    Patient ID: Kristen Rivas, female    DOB: 1935/06/26, 78 y.o.   MRN: [id number]  HPI  Here for medicare wellness  Diet: heart healthy and diabetic Physical activity: sedentary Depression/mood screen: negative Hearing: intact to whispered voice Visual acuity: grossly normal, performs annual eye exam  ADLs: capable Fall risk: none Home safety: good Cognitive evaluation: intact to orientation, naming, recall and repetition EOL planning: adv directives, full code/ I agree  I have personally reviewed and have noted 1. The patient's medical and social history 2. Their use of alcohol, tobacco or illicit drugs 3. Their current medications and supplements 4. The patient's functional ability including ADL's, fall risks, home safety risks and hearing or visual impairment. 5. Diet and physical activities 6. Evidence for depression or mood disorders   also reviewed chronic medical issues and interval medical issues -   hypertension - no longer taking beta-blocker (stopped 08/2009)- feels well controlled on azor, reports 100% med compliance - still feels fatigued but no chest pain, headache or edema or vision changes   palpitations - saw cards for same in early 08/2009 - no longer taking beta-blocker as did not feel beta-blocker making any difference in episodes - episodes still occur 1-2x/week but not in any particular pattern or trigger   anxiety - uses xanax as needed - sleeping ok- regular exercise which helps reduce stress -   DM2 - diet controlled - strong FH same - carefully monitors her diet and home cbgs  dyslipidemia - labs reviewed  - no problems with current meds - reduced dose simva 04/09/10 - very concerned re: possible adverse side effects and wants to check labs every 3-6 months to monitor    Past Medical History  Diagnosis Date  . CARDIAC ARRHYTHMIA   . CVA 07/2006    right thalamic CVA  . Palpitations   . Anxiety state, unspecified   . DIABETES MELLITUS,  TYPE II   . HYPERTENSION   . DYSLIPIDEMIA    Family History  Problem Relation Age of Onset  . Arthritis Mother   . Colon cancer Sister 3  . Lung cancer Sister   . Heart disease Brother   . Lung cancer Brother   . Diabetes Other     diabetes  . Arthritis Other   . Stroke Other     parent & grandparent   History  Substance Use Topics  . Smoking status: Never Smoker   . Smokeless tobacco: Not on file     Comment: Divorced, lives alone. Retired  . Alcohol Use: No    Review of Systems  Constitutional: Positive for fatigue. Negative for fever and unexpected weight change.  Respiratory: Negative for cough and shortness of breath.   Cardiovascular: Negative for chest pain and leg swelling.  Gastrointestinal: Negative for abdominal pain.  Genitourinary: Positive for dysuria, urgency and frequency. Negative for flank pain, enuresis and dyspareunia.       Objective:   Physical Exam BP 130/72  Pulse 80  Temp(Src) 97.5 F (36.4 C) (Oral)  Wt 138 lb (62.596 kg)  BMI 22.28 kg/m2  SpO2 98% Wt Readings from Last 3 Encounters:  03/08/13 138 lb (62.596 kg)  11/16/12 138 lb 12.8 oz (62.959 kg)  11/12/12 137 lb (62.143 kg)   Constitutional: She is thin/fit; appears well-developed and well-nourished. No distress. Neck: Normal range of motion. Neck supple. No JVD present. No thyromegaly present.  Cardiovascular: Normal rate, regular rhythm and normal heart sounds.  No murmur  heard. No BLE edema. Pulmonary/Chest: Effort normal and breath sounds normal. No respiratory distress. She has no wheezes.  Neurological: She is alert and oriented to person, place, and time. No cranial nerve deficit. Coordination normal.  Psychiatric: She is pleasant with slightly anxious mood/affect. Her behavior is normal. Judgment and thought content normal.    Lab Results  Component Value Date   WBC 6.4 09/03/2012   HGB 12.7 09/03/2012   HCT 38.1 09/03/2012   PLT 230.0 09/03/2012   GLUCOSE 88 09/03/2012    CHOL 155 09/03/2012   TRIG 83.0 09/03/2012   HDL 40.10 09/03/2012   LDLCALC 98 09/03/2012   ALT 20 09/03/2012   AST 20 09/03/2012   NA 141 09/03/2012   K 4.5 09/03/2012   CL 106 09/03/2012   CREATININE 0.5 09/03/2012   BUN 18 09/03/2012   CO2 29 09/03/2012   TSH 1.97 09/03/2012   HGBA1C 6.1 09/03/2012   POC U dip today unremarkable     Assessment & Plan:   AWV/v70.0 0- Today patient counseled on age appropriate routine health concerns for screening and prevention, each reviewed and up to date or declined. Immunizations reviewed and up to date or declined. Labs/ECG reviewed. Risk factors for depression reviewed and negative. Hearing function and visual acuity are intact. ADLs screened and addressed as needed. Functional ability and level of safety reviewed and appropriate. Education, counseling and referrals performed based on assessed risks today. Patient provided with a copy of personalized plan for preventive services.  also see problem list. Medications and labs reviewed today.  Dysuria - chronic bladder problems - s/p uro eval 12/2012 - no abnormalities identified - ok for prn antibiotics and continue pyridium as needed - interval hx reviewed

## 2013-03-08 NOTE — Patient Instructions (Signed)
It was good to see you today. We have reviewed your prior records including labs and tests today Health Maintenance reviewed - all recommended immunizations and age-appropriate screenings are up-to-date. Medications reviewed and updated, no changes recommended at this time. Test(s) ordered today. Your results will be released to MyChart (or called to you) after review, usually within 72hours after test completion. If any changes need to be made, you will be notified at that same time. Please schedule followup in 6 months, call sooner if problems.

## 2013-03-12 ENCOUNTER — Other Ambulatory Visit: Payer: Self-pay | Admitting: *Deleted

## 2013-03-12 MED ORDER — GLUCOSE BLOOD VI STRP: ORAL_STRIP | Status: DC

## 2013-03-12 NOTE — Telephone Encounter (Signed)
Received fax stating needing to get directions for accu chek BS strips. Can't be use as directed...lmb

## 2013-04-04 ENCOUNTER — Telehealth: Payer: Self-pay | Admitting: Internal Medicine

## 2013-04-04 MED ORDER — ZOSTER VACCINE LIVE 19400 UNT/0.65ML ~~LOC~~ SOLR: 0.6500 mL | Freq: Once | SUBCUTANEOUS | Status: DC

## 2013-04-04 NOTE — Telephone Encounter (Signed)
Ok to send order to [facility] - thanks

## 2013-04-04 NOTE — Telephone Encounter (Signed)
Notified [facility]...lmb

## 2013-04-04 NOTE — Telephone Encounter (Signed)
Pt called request rx for [facility]ostavax [facility] into [facility]. Offer to schedule an appt to get the shot here, but pt stated that it is cheaper to get there [facility]. Please call pt if this ok.

## 2013-04-08 ENCOUNTER — Other Ambulatory Visit: Payer: Self-pay | Admitting: *Deleted

## 2013-04-08 MED ORDER — GLUCOSE BLOOD VI STRP: ORAL_STRIP | Status: DC

## 2013-04-20 ENCOUNTER — Other Ambulatory Visit: Payer: Self-pay | Admitting: Internal Medicine

## 2013-04-22 NOTE — Telephone Encounter (Signed)
[facility].../lmb 

## 2013-05-03 DIAGNOSIS — E119 Type 2 diabetes mellitus without complications: Secondary | ICD-10-CM | POA: Diagnosis not present

## 2013-05-03 DIAGNOSIS — H47099 Other disorders of optic nerve, not elsewhere classified, unspecified eye: Secondary | ICD-10-CM | POA: Diagnosis not present

## 2013-05-03 DIAGNOSIS — H2510 Age-related nuclear cataract, unspecified eye: Secondary | ICD-10-CM | POA: Diagnosis not present

## 2013-05-03 DIAGNOSIS — H04129 Dry eye syndrome of unspecified lacrimal gland: Secondary | ICD-10-CM | POA: Diagnosis not present

## 2013-05-06 ENCOUNTER — Encounter: Payer: Self-pay | Admitting: Internal Medicine

## 2013-06-14 ENCOUNTER — Other Ambulatory Visit: Payer: Self-pay | Admitting: *Deleted

## 2013-06-14 MED ORDER — AMLODIPINE-OLMESARTAN 5-40 MG PO TABS: 1.0000 | ORAL_TABLET | Freq: Every morning | ORAL | Status: DC

## 2013-06-14 MED ORDER — CLOPIDOGREL BISULFATE 75 MG PO TABS: 75.0000 mg | ORAL_TABLET | Freq: Every day | ORAL | Status: DC

## 2013-07-14 DIAGNOSIS — J029 Acute pharyngitis, unspecified: Secondary | ICD-10-CM | POA: Diagnosis not present

## 2013-07-14 DIAGNOSIS — R059 Cough, unspecified: Secondary | ICD-10-CM | POA: Diagnosis not present

## 2013-07-14 DIAGNOSIS — J019 Acute sinusitis, unspecified: Secondary | ICD-10-CM | POA: Diagnosis not present

## 2013-07-14 DIAGNOSIS — R05 Cough: Secondary | ICD-10-CM | POA: Diagnosis not present

## 2013-07-14 DIAGNOSIS — R5381 Other malaise: Secondary | ICD-10-CM | POA: Diagnosis not present

## 2013-07-14 DIAGNOSIS — R51 Headache: Secondary | ICD-10-CM | POA: Diagnosis not present

## 2013-09-03 ENCOUNTER — Other Ambulatory Visit: Payer: Self-pay

## 2013-09-03 DIAGNOSIS — Z1231 Encounter for screening mammogram for malignant neoplasm of breast: Secondary | ICD-10-CM

## 2013-09-03 DIAGNOSIS — Z9882 Breast implant status: Secondary | ICD-10-CM

## 2013-09-13 ENCOUNTER — Ambulatory Visit: Payer: Medicare Other | Admitting: Internal Medicine

## 2013-11-15 ENCOUNTER — Encounter: Payer: Self-pay | Admitting: Internal Medicine

## 2013-11-15 ENCOUNTER — Other Ambulatory Visit (INDEPENDENT_AMBULATORY_CARE_PROVIDER_SITE_OTHER): Payer: Medicare Other

## 2013-11-15 ENCOUNTER — Ambulatory Visit (INDEPENDENT_AMBULATORY_CARE_PROVIDER_SITE_OTHER): Payer: Medicare Other | Admitting: Internal Medicine

## 2013-11-15 ENCOUNTER — Ambulatory Visit
Admission: RE | Admit: 2013-11-15 | Discharge: 2013-11-15 | Disposition: A | Discharge status: Home / Self Care | Payer: Medicare Other | Source: Ambulatory Visit

## 2013-11-15 VITALS — BP 140/64 | HR 75 | Temp 97.4°F | Ht 66.0 in | Wt 135.0 lb

## 2013-11-15 DIAGNOSIS — R5381 Other malaise: Secondary | ICD-10-CM

## 2013-11-15 DIAGNOSIS — E119 Type 2 diabetes mellitus without complications: Secondary | ICD-10-CM

## 2013-11-15 DIAGNOSIS — M899 Disorder of bone, unspecified: Secondary | ICD-10-CM

## 2013-11-15 DIAGNOSIS — R5383 Other fatigue: Secondary | ICD-10-CM

## 2013-11-15 DIAGNOSIS — E785 Hyperlipidemia, unspecified: Secondary | ICD-10-CM

## 2013-11-15 DIAGNOSIS — Z1231 Encounter for screening mammogram for malignant neoplasm of breast: Secondary | ICD-10-CM | POA: Diagnosis not present

## 2013-11-15 DIAGNOSIS — Z9882 Breast implant status: Secondary | ICD-10-CM

## 2013-11-15 DIAGNOSIS — I10 Essential (primary) hypertension: Secondary | ICD-10-CM

## 2013-11-15 DIAGNOSIS — M8580 Other specified disorders of bone density and structure, unspecified site: Secondary | ICD-10-CM

## 2013-11-15 DIAGNOSIS — M949 Disorder of cartilage, unspecified: Secondary | ICD-10-CM

## 2013-11-15 LAB — CBC WITH DIFFERENTIAL/PLATELET
BASOS ABS: 0 10*3/uL (ref 0.0–0.1)
BASOS PCT: 0.5 % (ref 0.0–3.0)
Eosinophils Absolute: 0.1 10*3/uL (ref 0.0–0.7)
Eosinophils Relative: 1.6 % (ref 0.0–5.0)
HEMATOCRIT: 39.9 % (ref 36.0–46.0)
HEMOGLOBIN: 13.4 g/dL (ref 12.0–15.0)
LYMPHS ABS: 2.1 10*3/uL (ref 0.7–4.0)
LYMPHS PCT: 29.9 % (ref 12.0–46.0)
MCHC: 33.7 g/dL (ref 30.0–36.0)
MCV: 90.2 fl (ref 78.0–100.0)
MONOS PCT: 8.1 % (ref 3.0–12.0)
Monocytes Absolute: 0.6 10*3/uL (ref 0.1–1.0)
NEUTROS ABS: 4.1 10*3/uL (ref 1.4–7.7)
Neutrophils Relative %: 59.9 % (ref 43.0–77.0)
Platelets: 222 10*3/uL (ref 150.0–400.0)
RBC: 4.43 Mil/uL (ref 3.87–5.11)
RDW: 13.3 % (ref 11.5–15.5)
WBC: 6.9 10*3/uL (ref 4.0–10.5)

## 2013-11-15 LAB — LIPID PANEL
CHOLESTEROL: 152 mg/dL (ref 0–200)
HDL: 43.2 mg/dL (ref >39.00)
LDL Cholesterol: 90 mg/dL (ref 0–99)
TRIGLYCERIDES: 95 mg/dL (ref 0.0–149.0)
Total CHOL/HDL Ratio: 4
VLDL: 19 mg/dL (ref 0.0–40.0)

## 2013-11-15 LAB — BASIC METABOLIC PANEL
BUN: 15 mg/dL (ref 6–23)
CALCIUM: 9.7 mg/dL (ref 8.4–10.5)
CO2: 29 meq/L (ref 19–32)
Chloride: 105 mEq/L (ref 96–112)
Creatinine, Ser: 0.6 mg/dL (ref 0.4–1.2)
GFR: 103.18 mL/min (ref >60.00)
GLUCOSE: 97 mg/dL (ref 70–99)
POTASSIUM: 4.7 meq/L (ref 3.5–5.1)
SODIUM: 142 meq/L (ref 135–145)

## 2013-11-15 LAB — HEPATIC FUNCTION PANEL
ALT: 17 U/L (ref 0–35)
AST: 19 U/L (ref 0–37)
Albumin: 4.2 g/dL (ref 3.5–5.2)
Alkaline Phosphatase: 83 U/L (ref 39–117)
Bilirubin, Direct: 0.1 mg/dL (ref 0.0–0.3)
Total Bilirubin: 0.7 mg/dL (ref 0.2–1.2)
Total Protein: 7 g/dL (ref 6.0–8.3)

## 2013-11-15 LAB — HEMOGLOBIN A1C: HEMOGLOBIN A1C: 6.2 % (ref 4.6–6.5)

## 2013-11-15 LAB — TSH: TSH: 1.02 u[IU]/mL (ref 0.35–4.50)

## 2013-11-15 LAB — MICROALBUMIN / CREATININE URINE RATIO
Creatinine,U: 122.8 mg/dL
MICROALB/CREAT RATIO: 0.5 mg/g (ref 0.0–30.0)
Microalb, Ur: 0.6 mg/dL (ref 0.0–1.9)

## 2013-11-15 NOTE — Assessment & Plan Note (Signed)
On statin - pt expects labs to be checked every 3-6 months - continued to reassure she never has any problems and approp for annual testing,... but labs ordered today as requested

## 2013-11-15 NOTE — Patient Instructions (Signed)
It was good to see you today.  We have reviewed your prior records including labs and tests today  Test(s) ordered today. Your results will be released to [place] (or called/mailed to you) after review, usually within 72hours after test completion. If any changes need to be made, you will be notified at that same time.  Medications reviewed and updated, no changes recommended at this time. Refill on medication(s) as discussed today.  we'll schedule DEXa (bone density) for mid 02/2014 or later. Our office will contact you regarding results after review.  Please schedule followup in 6 months, call sooner if problems.

## 2013-11-15 NOTE — Progress Notes (Signed)
Subjective:    Patient ID: Kristen Rivas, female    DOB: 1935-01-14, 79 y.o.   MRN: [id number]  HPI  Patient is here for follow up  Reviewed chronic medical issues and interval medical events  Past Medical History  Diagnosis Date  . CARDIAC ARRHYTHMIA   . CVA 07/2006    right thalamic CVA  . Palpitations   . Anxiety state, unspecified   . DIABETES MELLITUS, TYPE II   . HYPERTENSION   . DYSLIPIDEMIA     Review of Systems  Constitutional: Positive for fatigue. Negative for fever and unexpected weight change.  Respiratory: Negative for cough and shortness of breath.   Cardiovascular: Negative for chest pain and leg swelling.       Objective:   Physical Exam  BP 140/64  Pulse 75  Temp(Src) 97.4 F (36.3 C) (Oral)  Ht 5\' 6"  (1.676 m)  Wt 135 lb (61.236 kg)  BMI 21.80 kg/m2  SpO2 99% Wt Readings from Last 3 Encounters:  11/15/13 135 lb (61.236 kg)  03/08/13 138 lb (62.596 kg)  11/16/12 138 lb 12.8 oz (62.959 kg)   Constitutional: She appears well-developed and well-nourished. No distress.  Neck: Normal range of motion. Neck supple. No JVD present. No thyromegaly present.  Cardiovascular: Normal rate, regular rhythm and normal heart sounds.  No murmur heard. No BLE edema. Pulmonary/Chest: Effort normal and breath sounds normal. No respiratory distress. She has no wheezes.  Psychiatric: She has a normal mood and affect. Her behavior is normal. Judgment and thought content normal.   Lab Results  Component Value Date   WBC 6.4 09/03/2012   HGB 12.7 09/03/2012   HCT 38.1 09/03/2012   PLT 230.0 09/03/2012   GLUCOSE 95 03/08/2013   CHOL 159 03/08/2013   TRIG 112.0 03/08/2013   HDL 42.80 03/08/2013   LDLCALC 94 03/08/2013   ALT 20 09/03/2012   AST 20 09/03/2012   NA 140 03/08/2013   K 4.3 03/08/2013   CL 106 03/08/2013   CREATININE 0.5 03/08/2013   BUN 16 03/08/2013   CO2 30 03/08/2013   TSH 1.97 09/03/2012   HGBA1C 6.1 03/08/2013    No results found.     Assessment &  Plan:   Fatigue - nonspecific symptoms/exam - check screening labs  Problem List Items Addressed This Visit   DIABETES MELLITUS, TYPE II      Strong FH - controls self with diet -  status post nutrition education in 2011 with maggie at [facility] recheck a1c now Lab Results  Component Value Date   HGBA1C 6.1 03/08/2013      Relevant Orders      Basic metabolic panel      Lipid panel      Hemoglobin A1c      Microalbumin / creatinine urine ratio   DYSLIPIDEMIA - Primary     On statin - pt expects labs to be checked every 3-6 months - continued to reassure she never has any problems and approp for annual testing,... but labs ordered today as requested    Relevant Orders      Lipid panel   FATIGUE   Relevant Orders      Basic metabolic panel      CBC with Differential      Hepatic function panel      Lipid panel      TSH      Vit D  25 hydroxy (rtn osteoporosis monitoring)   HYPERTENSION  The current medical regimen is effective;  continue present plan and medications. BP Readings from Last 3 Encounters:  11/15/13 140/64  03/08/13 130/72  11/16/12 132/60      Relevant Orders      Basic metabolic panel   Osteopenia   Relevant Orders      DG Bone Density      Vit D  25 hydroxy (rtn osteoporosis monitoring)

## 2013-11-15 NOTE — Assessment & Plan Note (Signed)
Strong FH - controls self with diet -  status post nutrition education in 2011 with maggie at [facility] center recheck a1c now Lab Results  Component Value Date   HGBA1C 6.1 03/08/2013

## 2013-11-15 NOTE — Assessment & Plan Note (Signed)
The current medical regimen is effective;  continue present plan and medications. BP Readings from Last 3 Encounters:  11/15/13 140/64  03/08/13 130/72  11/16/12 132/60

## 2013-11-15 NOTE — Progress Notes (Signed)
Pre visit review using our clinic review tool, if applicable. No additional management support is needed unless otherwise documented below in the visit note. 

## 2013-11-16 LAB — VITAMIN D 25 HYDROXY (VIT D DEFICIENCY, FRACTURES): VIT D 25 HYDROXY: 32 ng/mL (ref 30–89)

## 2013-11-28 DIAGNOSIS — L820 Inflamed seborrheic keratosis: Secondary | ICD-10-CM | POA: Diagnosis not present

## 2013-11-28 DIAGNOSIS — Z85828 Personal history of other malignant neoplasm of skin: Secondary | ICD-10-CM | POA: Diagnosis not present

## 2013-11-28 DIAGNOSIS — D1801 Hemangioma of skin and subcutaneous tissue: Secondary | ICD-10-CM | POA: Diagnosis not present

## 2013-11-28 DIAGNOSIS — L819 Disorder of pigmentation, unspecified: Secondary | ICD-10-CM | POA: Diagnosis not present

## 2013-11-28 DIAGNOSIS — L821 Other seborrheic keratosis: Secondary | ICD-10-CM | POA: Diagnosis not present

## 2013-11-28 DIAGNOSIS — D239 Other benign neoplasm of skin, unspecified: Secondary | ICD-10-CM | POA: Diagnosis not present

## 2013-12-22 DIAGNOSIS — N390 Urinary tract infection, site not specified: Secondary | ICD-10-CM | POA: Diagnosis not present

## 2014-01-05 ENCOUNTER — Other Ambulatory Visit: Payer: Self-pay | Admitting: Internal Medicine

## 2014-02-04 ENCOUNTER — Ambulatory Visit (INDEPENDENT_AMBULATORY_CARE_PROVIDER_SITE_OTHER): Payer: Medicare Other | Admitting: Internal Medicine

## 2014-02-04 ENCOUNTER — Encounter: Payer: Self-pay | Admitting: Internal Medicine

## 2014-02-04 ENCOUNTER — Other Ambulatory Visit: Payer: Medicare Other

## 2014-02-04 VITALS — BP 132/64 | HR 77 | Temp 97.9°F | Ht 66.0 in | Wt 140.0 lb

## 2014-02-04 DIAGNOSIS — R32 Unspecified urinary incontinence: Secondary | ICD-10-CM

## 2014-02-04 DIAGNOSIS — I6350 Cerebral infarction due to unspecified occlusion or stenosis of unspecified cerebral artery: Secondary | ICD-10-CM | POA: Diagnosis not present

## 2014-02-04 DIAGNOSIS — T83511A Infection and inflammatory reaction due to indwelling urethral catheter, initial encounter: Secondary | ICD-10-CM

## 2014-02-04 DIAGNOSIS — N390 Urinary tract infection, site not specified: Secondary | ICD-10-CM

## 2014-02-04 DIAGNOSIS — I10 Essential (primary) hypertension: Secondary | ICD-10-CM

## 2014-02-04 DIAGNOSIS — T83511D Infection and inflammatory reaction due to indwelling urethral catheter, subsequent encounter: Principal | ICD-10-CM

## 2014-02-04 DIAGNOSIS — N8111 Cystocele, midline: Secondary | ICD-10-CM | POA: Diagnosis not present

## 2014-02-04 DIAGNOSIS — N8110 Cystocele, unspecified: Secondary | ICD-10-CM

## 2014-02-04 DIAGNOSIS — E785 Hyperlipidemia, unspecified: Secondary | ICD-10-CM

## 2014-02-04 LAB — POCT URINALYSIS DIPSTICK
BILIRUBIN UA: NEGATIVE
Glucose, UA: NEGATIVE
KETONES UA: NEGATIVE
LEUKOCYTES UA: NEGATIVE
Nitrite, UA: NEGATIVE
Protein, UA: NEGATIVE
RBC UA: NEGATIVE
Spec Grav, UA: 1.02
Urobilinogen, UA: NEGATIVE
pH, UA: 5

## 2014-02-04 NOTE — Patient Instructions (Addendum)
It was good to see you today.  We have reviewed your prior records including labs and tests today  Urine culture ordered today. Your results will be released to [place] (or called to you) after review, usually within 72hours after test completion. If any changes need to be made, you will be notified at that same time.  Medications reviewed and updated, no changes recommended at this time.  we'll make referral to gyn Dr Marvel Plan to evaluate your bladder symptoms and problems . Our office will contact you regarding appointment(s) once made.  Please keep scheduled followup as planned, call sooner if problems.  Urodynamic Study A urodynamic study is a set of tests and X-rays. These tests help to find out why you are having problems holding on to your pee (urine) or with peeing (urinating). It helps the doctor see your:  Bladder.  Urethra.  Valves in your body that control your pee (sphincters). TEST  Two thin tubes (catheters) are used. One is put in your bladder and the other is put into where your poop comes out (rectum).  The tube that is put into your bladder will be filled with a cup of germ free (sterile) water. The tubes help check how your bladder is doing as it is being filled up.  Once your bladder is filled, X-rays are taken while you cough or push down as if you were trying to poop (have a bowel movement).  This test will show why you are having a problem with leaking pee.  In the last part of the test, you will need to pee while the tube is still in your bladder.  The whole test will take about 30 to 45 minutes. When it is done, your doctor will talk to you about what kinds of treatments may work best for you. Document Released: 06/09/2008 Document Revised: 09/19/2011 Document Reviewed: 10/07/2013 [facility] Patient Information 2015 [place], [place]. This information is not intended to replace advice given to you by your health care provider. Make sure you discuss any  questions you have with your health care provider.

## 2014-02-04 NOTE — Progress Notes (Signed)
HPI: complains of recurrent UTI symptoms Current symptoms nset >3 weeks ago, [facility] recurrent problems for >54mo - pt frustrated with same associated with dysuria and small volume voiding with increased frequency denies hematuria, flank pain or fever The patient has a history of prior UTI  Past Medical History  Diagnosis Date  . CARDIAC ARRHYTHMIA   . CVA 07/2006    right thalamic CVA  . Palpitations   . Anxiety state, unspecified   . DIABETES MELLITUS, TYPE II   . HYPERTENSION   . DYSLIPIDEMIA     ROS:  Gen.: No unexpected weight change, no night sweats Lungs: No cough or shortness of breath Cardiovascular: No palpitations or chest pain  PE: BP 132/64  Pulse 77  Temp(Src) 97.9 F (36.6 C) (Oral)  Ht 5\' 6"  (1.676 m)  Wt 140 lb (63.504 kg)  BMI 22.61 kg/m2  SpO2 96% General: No acute distress Lungs: Clear to auscultation Cardiovascular: Regular rate rhythm, no edema Abdomen: Mild discomfort of her suprapubic region, no flank tenderness to palpation GU: not examined today Psyc: anxious  Lab Results  Component Value Date   WBC 6.9 11/15/2013   HGB 13.4 11/15/2013   HCT 39.9 11/15/2013   PLT 222.0 11/15/2013   GLUCOSE 97 11/15/2013   CHOL 152 11/15/2013   TRIG 95.0 11/15/2013   HDL 43.20 11/15/2013   LDLCALC 90 11/15/2013   ALT 17 11/15/2013   AST 19 11/15/2013   NA 142 11/15/2013   K 4.7 11/15/2013   CL 105 11/15/2013   CREATININE 0.6 11/15/2013   BUN 15 11/15/2013   CO2 29 11/15/2013   TSH 1.02 11/15/2013   HGBA1C 6.2 11/15/2013   MICROALBUR 0.6 11/15/2013    Assessment/Plan: Dysuria -UTI? classic symptoms (pressure, urgency) with history of same, [facility] for same, but POC Udip unremarkable today  Hold empiric antibiotic at this time (reviewed possible Cipro allg reaction - mouth sores) Urine culture for identification and sensitivities Hydration recommended education provided uro eval 12/2012 ([place]) reviewed - pt does  not wish to return for re-eval there so advised follow up with gyn Sharen Heck) for chronic bladder symptoms    Problem List Items Addressed This Visit   CVA     TIA 07/2006 - R thalamic - "?bleeder" per pt recollection In 2012 stopped ASA 81 and continued only plavix  - Now concerned about bleeding risk - Reviewed pro/con of various antiplt therapy - Given co-morbid risk for CVA recurrence, I advise continued Plavix tx we reviewed potential risk/benefit and possible side effects - pt understands and agrees to same     HYPERTENSION      The current medical regimen is effective;  continue present plan and medications. BP Readings from Last 3 Encounters:  02/04/14 132/64  11/15/13 140/64  03/08/13 130/72      URINARY INCONTINENCE     Chronic symptoms - prior uro eval 12/2012 reviewed Gyn prev suggested "sling" - refer back to gyn given increasing symptoms     Relevant Orders      Ambulatory referral to Obstetrics / Gynecology      Urine culture    Other Visit Diagnoses   Urinary tract infection associated with catheterization of urinary tract, subsequent encounter    -  Primary    Relevant Orders       Urine culture    Bladder prolapse, female, acquired        Relevant  Orders       Ambulatory referral to Obstetrics / Gynecology       Urine culture       Time spent with pt today 53minutes, greater than 50% time spent counseling patient on urinary symptoms and medication review. Also review of prior records including [facility] in [place] 12/[facility]

## 2014-02-04 NOTE — Assessment & Plan Note (Signed)
Chronic symptoms - prior uro eval 12/2012 reviewed Gyn prev suggested "sling" - refer back to gyn given increasing symptoms

## 2014-02-04 NOTE — Assessment & Plan Note (Signed)
On statin - pt expects labs to be checked every 3-6 months - continued to reassure she is approp for annual testing... but will check labs as requested  

## 2014-02-04 NOTE — Progress Notes (Signed)
Pre visit review using our clinic review tool, if applicable. No additional management support is needed unless otherwise documented below in the visit note. 

## 2014-02-04 NOTE — Assessment & Plan Note (Signed)
The current medical regimen is effective;  continue present plan and medications. BP Readings from Last 3 Encounters:  02/04/14 132/64  11/15/13 140/64  03/08/13 130/72

## 2014-02-04 NOTE — Assessment & Plan Note (Signed)
TIA 07/2006 - R thalamic - "?bleeder" per pt recollection In 2012 stopped ASA 81 and continued only plavix  - Now concerned about bleeding risk - Reviewed pro/con of various antiplt therapy - Given co-morbid risk for CVA recurrence, I advise continued Plavix tx we reviewed potential risk/benefit and possible side effects - pt understands and agrees to same

## 2014-02-05 LAB — URINE CULTURE
Colony Count: NO GROWTH
Organism ID, Bacteria: NO GROWTH

## 2014-02-24 ENCOUNTER — Ambulatory Visit (INDEPENDENT_AMBULATORY_CARE_PROVIDER_SITE_OTHER)
Admission: RE | Admit: 2014-02-24 | Discharge: 2014-02-24 | Disposition: A | Discharge status: Home / Self Care | Payer: Medicare Other | Source: Ambulatory Visit | Attending: Internal Medicine | Admitting: Internal Medicine

## 2014-02-24 DIAGNOSIS — M949 Disorder of cartilage, unspecified: Secondary | ICD-10-CM

## 2014-02-24 DIAGNOSIS — M899 Disorder of bone, unspecified: Secondary | ICD-10-CM

## 2014-02-24 DIAGNOSIS — M8580 Other specified disorders of bone density and structure, unspecified site: Secondary | ICD-10-CM

## 2014-02-26 DIAGNOSIS — Z01419 Encounter for gynecological examination (general) (routine) without abnormal findings: Secondary | ICD-10-CM | POA: Diagnosis not present

## 2014-02-26 DIAGNOSIS — R3919 Other difficulties with micturition: Secondary | ICD-10-CM | POA: Diagnosis not present

## 2014-02-26 DIAGNOSIS — N952 Postmenopausal atrophic vaginitis: Secondary | ICD-10-CM | POA: Diagnosis not present

## 2014-02-27 DIAGNOSIS — N390 Urinary tract infection, site not specified: Secondary | ICD-10-CM | POA: Diagnosis not present

## 2014-03-03 ENCOUNTER — Encounter: Payer: Self-pay | Admitting: Internal Medicine

## 2014-03-18 ENCOUNTER — Other Ambulatory Visit: Payer: Self-pay | Admitting: Internal Medicine

## 2014-04-10 DIAGNOSIS — D485 Neoplasm of uncertain behavior of skin: Secondary | ICD-10-CM | POA: Diagnosis not present

## 2014-04-10 DIAGNOSIS — L821 Other seborrheic keratosis: Secondary | ICD-10-CM | POA: Diagnosis not present

## 2014-04-11 DIAGNOSIS — L820 Inflamed seborrheic keratosis: Secondary | ICD-10-CM | POA: Diagnosis not present

## 2014-05-13 DIAGNOSIS — R300 Dysuria: Secondary | ICD-10-CM | POA: Diagnosis not present

## 2014-05-13 DIAGNOSIS — N8111 Cystocele, midline: Secondary | ICD-10-CM | POA: Diagnosis not present

## 2014-05-13 DIAGNOSIS — N3020 Other chronic cystitis without hematuria: Secondary | ICD-10-CM | POA: Diagnosis not present

## 2014-05-13 DIAGNOSIS — N952 Postmenopausal atrophic vaginitis: Secondary | ICD-10-CM | POA: Diagnosis not present

## 2014-05-14 ENCOUNTER — Telehealth: Payer: Self-pay

## 2014-05-14 NOTE — Telephone Encounter (Signed)
LVM for pt to call back.   Pt sent results to Dr. Asa Lente in attempts to get MD's opinion.

## 2014-05-15 DIAGNOSIS — H1851 Endothelial corneal dystrophy: Secondary | ICD-10-CM | POA: Diagnosis not present

## 2014-05-15 DIAGNOSIS — H2513 Age-related nuclear cataract, bilateral: Secondary | ICD-10-CM | POA: Diagnosis not present

## 2014-05-15 DIAGNOSIS — E119 Type 2 diabetes mellitus without complications: Secondary | ICD-10-CM | POA: Diagnosis not present

## 2014-05-15 LAB — HM DIABETES EYE EXAM

## 2014-05-20 ENCOUNTER — Telehealth: Payer: Self-pay | Admitting: Internal Medicine

## 2014-05-20 NOTE — Telephone Encounter (Signed)
Received 1 page from [place]., sent to Dr. Nira Retort. 05/20/14/ss

## 2014-05-26 ENCOUNTER — Encounter: Payer: Medicare Other | Admitting: Internal Medicine

## 2014-05-30 ENCOUNTER — Encounter: Payer: Self-pay | Admitting: Internal Medicine

## 2014-06-09 DIAGNOSIS — N8111 Cystocele, midline: Secondary | ICD-10-CM | POA: Diagnosis not present

## 2014-06-09 DIAGNOSIS — N952 Postmenopausal atrophic vaginitis: Secondary | ICD-10-CM | POA: Diagnosis not present

## 2014-06-09 DIAGNOSIS — K5790 Diverticulosis of intestine, part unspecified, without perforation or abscess without bleeding: Secondary | ICD-10-CM | POA: Diagnosis not present

## 2014-06-09 DIAGNOSIS — N3020 Other chronic cystitis without hematuria: Secondary | ICD-10-CM | POA: Diagnosis not present

## 2014-06-10 ENCOUNTER — Other Ambulatory Visit: Payer: Self-pay | Admitting: Internal Medicine

## 2014-06-10 ENCOUNTER — Ambulatory Visit (INDEPENDENT_AMBULATORY_CARE_PROVIDER_SITE_OTHER): Payer: Medicare Other | Admitting: Internal Medicine

## 2014-06-10 ENCOUNTER — Encounter: Payer: Self-pay | Admitting: Internal Medicine

## 2014-06-10 ENCOUNTER — Telehealth: Payer: Self-pay

## 2014-06-10 ENCOUNTER — Other Ambulatory Visit (INDEPENDENT_AMBULATORY_CARE_PROVIDER_SITE_OTHER): Payer: Medicare Other

## 2014-06-10 VITALS — BP 124/64 | HR 75 | Temp 98.3°F | Resp 13 | Ht 65.0 in | Wt 140.0 lb

## 2014-06-10 DIAGNOSIS — R7309 Other abnormal glucose: Secondary | ICD-10-CM

## 2014-06-10 DIAGNOSIS — R7303 Prediabetes: Secondary | ICD-10-CM

## 2014-06-10 DIAGNOSIS — M859 Disorder of bone density and structure, unspecified: Secondary | ICD-10-CM | POA: Diagnosis not present

## 2014-06-10 DIAGNOSIS — I10 Essential (primary) hypertension: Secondary | ICD-10-CM

## 2014-06-10 DIAGNOSIS — I639 Cerebral infarction, unspecified: Secondary | ICD-10-CM

## 2014-06-10 DIAGNOSIS — M8580 Other specified disorders of bone density and structure, unspecified site: Secondary | ICD-10-CM

## 2014-06-10 DIAGNOSIS — E785 Hyperlipidemia, unspecified: Secondary | ICD-10-CM

## 2014-06-10 DIAGNOSIS — I6350 Cerebral infarction due to unspecified occlusion or stenosis of unspecified cerebral artery: Secondary | ICD-10-CM

## 2014-06-10 LAB — HEMOGLOBIN A1C: HEMOGLOBIN A1C: 6.5 % (ref 4.6–6.5)

## 2014-06-10 LAB — HEPATIC FUNCTION PANEL
ALT: 19 U/L (ref 0–35)
AST: 23 U/L (ref 0–37)
Albumin: 4.1 g/dL (ref 3.5–5.2)
Alkaline Phosphatase: 92 U/L (ref 39–117)
BILIRUBIN DIRECT: 0.2 mg/dL (ref 0.0–0.3)
Total Bilirubin: 1.1 mg/dL (ref 0.2–1.2)
Total Protein: 6.4 g/dL (ref 6.0–8.3)

## 2014-06-10 LAB — BASIC METABOLIC PANEL
BUN: 16 mg/dL (ref 6–23)
CHLORIDE: 105 meq/L (ref 96–112)
CO2: 29 mEq/L (ref 19–32)
CREATININE: 0.6 mg/dL (ref 0.4–1.2)
Calcium: 9.6 mg/dL (ref 8.4–10.5)
GFR: 101.08 mL/min (ref >60.00)
Glucose, Bld: 105 mg/dL — ABNORMAL HIGH (ref 70–99)
POTASSIUM: 4.9 meq/L (ref 3.5–5.1)
Sodium: 141 mEq/L (ref 135–145)

## 2014-06-10 LAB — VITAMIN D 25 HYDROXY (VIT D DEFICIENCY, FRACTURES): VITD: 24.07 ng/mL — AB (ref 30.00–100.00)

## 2014-06-10 NOTE — Telephone Encounter (Signed)
Pt came in for appt and wanted to let us know that there is an error in the Care Team Providers. Pt urologist should be Phebe Colla, M.D.

## 2014-06-10 NOTE — Assessment & Plan Note (Signed)
Blood pressure goals reviewed. BMET 

## 2014-06-10 NOTE — Patient Instructions (Signed)
Your next office appointment will be determined based upon review of your pending labs. Those instructions will be transmitted to you through by mail  Minimal Blood Pressure Goal= AVERAGE < 140/90;  Ideal is an AVERAGE < 135/85. This AVERAGE should be calculated from @ least 5-7 BP readings taken @ different times of day on different days of week. You should not respond to isolated BP readings , but rather the AVERAGE for that week .Please bring your  blood pressure cuff to office visits to verify that it is reliable.It  can also be checked against the blood pressure device at the pharmacy. Finger or wrist cuffs are not dependable; an arm cuff is.

## 2014-06-10 NOTE — Assessment & Plan Note (Signed)
A1c , urine microalbumin, BMET 

## 2014-06-10 NOTE — Assessment & Plan Note (Signed)
Vitamin D level 

## 2014-06-10 NOTE — Telephone Encounter (Signed)
Thanks for the update I have updated care team as requested

## 2014-06-10 NOTE — Progress Notes (Signed)
Pre visit review using our clinic review tool, if applicable. No additional management support is needed unless otherwise documented below in the visit note. 

## 2014-06-10 NOTE — Progress Notes (Signed)
   Subjective:    Patient ID: Kristen Rivas, female    DOB: 1935/01/05, 79 y.o.   MRN: [id number]  HPI  She is here to assess active health issues & conditions. PMH, FH, & Social history verified & updated .  She is on a heart healthy, no added sodium diet. She exercises 3 times a week at the gym without associated cardiopulmonary symptoms  She is not monitoring her blood pressure at home but is compliant with her medications. She has no adverse effects.  She has a history of diverticulosis without history of diverticulitis. She's never had colon polyps; a sister did have colon cancer in her 90s.    Review of Systems   Chest pain, palpitations, tachycardia, exertional dyspnea, paroxysmal nocturnal dyspnea, claudication or edema are absent.  Unexplained weight loss, abdominal pain, significant dyspepsia, dysphagia, melena, rectal bleeding, or persistently small caliber stools are denied.     Objective:   Physical Exam Gen.: Healthy and well-nourished in appearance. Alert, appropriate and cooperative throughout exam. Appears younger than stated age  Head: Normocephalic without obvious abnormalities  Eyes: No corneal or conjunctival inflammation noted. Pupils equal round reactive to light and accommodation. Extraocular motion intact.  Ears: External  ear exam reveals no significant lesions or deformities. Canals clear .TMs normal. Hearing is grossly normal bilaterally. Nose: External nasal exam reveals no deformity or inflammation. Nasal mucosa are pink and moist. No lesions or exudates noted.   Mouth: Oral mucosa and oropharynx reveal no lesions or exudates. Teeth in good repair. Neck: No deformities, masses, or tenderness noted. Range of motion & Thyroid normal. Lungs: Normal respiratory effort; chest expands symmetrically. Lungs are clear to auscultation without rales, wheezes, or increased work of breathing. Heart: Normal rate and rhythm. Normal S1 and S2. No gallop, click, or rub. No   murmur. Abdomen: Bowel sounds normal; abdomen soft and nontender. No masses, organomegaly or hernias noted. Genitalia:  as per Gyn                                  Musculoskeletal/extremities: No deformity or scoliosis noted of  the thoracic or lumbar spine.  No clubbing, cyanosis, edema, or significant extremity  deformity noted.  Range of motion normal . Tone & strength normal. Hand joints reveal mild  PIP changes.  Fingernail health good. Crepitus of knees , L > R Able to lie down & sit up w/o help.  Negative SLR bilaterally Vascular: Carotid, radial artery, dorsalis pedis and  posterior tibial pulses are full and equal.  Aorta palpable ; no AAA. Bruit present Neurologic: Alert and oriented x3. Deep tendon reflexes symmetrical and normal.  Gait normal   Skin: Intact without suspicious lesions or rashes. Lymph: No cervical, axillary lymphadenopathy present. Psych: Mood and affect are normal. Normally interactive                                                                                        Assessment & Plan:  See Current Assessment & Plan in Problem List under specific Diagnosis

## 2014-06-11 ENCOUNTER — Telehealth: Payer: Self-pay | Admitting: Internal Medicine

## 2014-06-11 NOTE — Telephone Encounter (Signed)
emmi emailed °

## 2014-06-12 LAB — NMR LIPOPROFILE WITH LIPIDS
Cholesterol, Total: 173 mg/dL (ref 100–199)
HDL Particle Number: 36 umol/L (ref >=30.5)
HDL SIZE: 8.6 nm — AB (ref >=9.2)
HDL-C: 44 mg/dL (ref >39)
LARGE HDL: 3.4 umol/L — AB (ref >=4.8)
LDL (calc): 111 mg/dL — ABNORMAL HIGH (ref 0–99)
LDL Particle Number: 1477 nmol/L — ABNORMAL HIGH (ref <1000)
LDL SIZE: 20.6 nm (ref >=20.8)
LP-IR SCORE: 63 — AB (ref <=45)
Large VLDL-P: 3.2 nmol/L — ABNORMAL HIGH (ref <=2.7)
Small LDL Particle Number: 748 nmol/L — ABNORMAL HIGH (ref <=527)
TRIGLYCERIDES: 88 mg/dL (ref 0–149)
VLDL Size: 44 nm (ref <=46.6)

## 2014-06-12 NOTE — Telephone Encounter (Signed)
Pt informed that care team has been updated.

## 2014-06-24 DIAGNOSIS — N8320 Unspecified ovarian cysts: Secondary | ICD-10-CM | POA: Diagnosis not present

## 2014-08-30 ENCOUNTER — Encounter: Payer: Self-pay | Admitting: Internal Medicine

## 2014-08-30 ENCOUNTER — Ambulatory Visit (INDEPENDENT_AMBULATORY_CARE_PROVIDER_SITE_OTHER): Payer: Medicare Other | Admitting: Internal Medicine

## 2014-08-30 VITALS — BP 110/70 | HR 81 | Temp 98.2°F | Ht 65.0 in | Wt 142.5 lb

## 2014-08-30 DIAGNOSIS — R350 Frequency of micturition: Secondary | ICD-10-CM

## 2014-08-30 DIAGNOSIS — N390 Urinary tract infection, site not specified: Secondary | ICD-10-CM | POA: Diagnosis not present

## 2014-08-30 DIAGNOSIS — R309 Painful micturition, unspecified: Secondary | ICD-10-CM | POA: Diagnosis not present

## 2014-08-30 DIAGNOSIS — N3080 Other cystitis without hematuria: Secondary | ICD-10-CM | POA: Diagnosis not present

## 2014-08-30 DIAGNOSIS — N3090 Cystitis, unspecified without hematuria: Secondary | ICD-10-CM

## 2014-08-30 LAB — POCT URINALYSIS DIPSTICK
Bilirubin, UA: NEGATIVE
Glucose, UA: NEGATIVE
KETONES UA: NEGATIVE
Nitrite, UA: NEGATIVE
PH UA: 6.5
Protein, UA: NEGATIVE
RBC UA: NEGATIVE
SPEC GRAV UA: 1.01
Urobilinogen, UA: NEGATIVE

## 2014-08-30 MED ORDER — SULFAMETHOXAZOLE-TRIMETHOPRIM 800-160 MG PO TABS: 1.0000 | ORAL_TABLET | Freq: Two times a day (BID) | ORAL | Status: DC

## 2014-08-30 NOTE — Patient Instructions (Addendum)
Culture  results pending. Can begin antibiotic if needed.  Urinary Tract Infection Urinary tract infections (UTIs) can develop anywhere along your urinary tract. Your urinary tract is your body's drainage system for removing wastes and extra water. Your urinary tract includes two kidneys, two ureters, a bladder, and a urethra. Your kidneys are a pair of bean-shaped organs. Each kidney is about the size of your fist. They are located below your ribs, one on each side of your spine. CAUSES Infections are caused by microbes, which are microscopic organisms, including fungi, viruses, and bacteria. These organisms are so small that they can only be seen through a microscope. Bacteria are the microbes that most commonly cause UTIs. SYMPTOMS  Symptoms of UTIs may vary by age and gender of the patient and by the location of the infection. Symptoms in young women typically include a frequent and intense urge to urinate and a painful, burning feeling in the bladder or urethra during urination. Older women and men are more likely to be tired, shaky, and weak and have muscle aches and abdominal pain. A fever may mean the infection is in your kidneys. Other symptoms of a kidney infection include pain in your back or sides below the ribs, nausea, and vomiting. DIAGNOSIS To diagnose a UTI, your caregiver will ask you about your symptoms. Your caregiver also will ask to provide a urine sample. The urine sample will be tested for bacteria and white blood cells. White blood cells are made by your body to help fight infection. TREATMENT  Typically, UTIs can be treated with medication. Because most UTIs are caused by a bacterial infection, they usually can be treated with the use of antibiotics. The choice of antibiotic and length of treatment depend on your symptoms and the type of bacteria causing your infection. HOME CARE INSTRUCTIONS  If you were prescribed antibiotics, take them exactly as your caregiver instructs  you. Finish the medication even if you feel better after you have only taken some of the medication.  Drink enough water and fluids to keep your urine clear or pale yellow.  Avoid caffeine, tea, and carbonated beverages. They tend to irritate your bladder.  Empty your bladder often. Avoid holding urine for long periods of time.  Empty your bladder before and after sexual intercourse.  After a bowel movement, women should cleanse from front to back. Use each tissue only once. SEEK MEDICAL CARE IF:   You have back pain.  You develop a fever.  Your symptoms do not begin to resolve within 3 days. SEEK IMMEDIATE MEDICAL CARE IF:   You have severe back pain or lower abdominal pain.  You develop chills.  You have nausea or vomiting.  You have continued burning or discomfort with urination. MAKE SURE YOU:   Understand these instructions.  Will watch your condition.  Will get help right away if you are not doing well or get worse. Document Released: 04/06/2005 Document Revised: 12/27/2011 Document Reviewed: 08/05/2011 [facility] Patient Information 2015 [place], [place]. This information is not intended to replace advice given to you by your health care provider. Make sure you discuss any questions you have with your health care provider.

## 2014-08-30 NOTE — Progress Notes (Signed)
Pre visit review using our clinic review tool, if applicable. No additional management support is needed unless otherwise documented below in the visit note.   Chief Complaint  Patient presents with  . Urinary Tract Infection    started yesterday. C/O frequency & pain when urinating. Took Pyridium last night    HPI: Patient comes in today for [facility] Saturday clinic for  new problem evaluation. Has seen urologist  Started last pm  And took pyridium and helped . Last uti rx   Months ago .  No fever  Now one day of dysruia and  Frequency . Dribble  ROS: See pertinent positives and negatives per HPI. No fever chills hematuria   Past Medical History  Diagnosis Date  . CARDIAC ARRHYTHMIA   . CVA 07/2006    right thalamic CVA  . Palpitations   . Anxiety state, unspecified   . DIABETES MELLITUS, TYPE II   . HYPERTENSION   . DYSLIPIDEMIA     Family History  Problem Relation Age of Onset  . Arthritis Mother   . Colon cancer Sister 60  . Lung cancer Sister     non smoker  . Heart disease Brother   . Lung cancer Brother     X3, all smokers  . Diabetes Other     diabetes  . Arthritis Other   . Stroke Other     parent &  M grandfather    History   Social History  . Marital Status: Divorced    Spouse Name: N/A  . Number of Children: N/A  . Years of Education: N/A   Social History Main Topics  . Smoking status: Never Smoker   . Smokeless tobacco: Not on file     Comment: Divorced, lives alone. Retired  . Alcohol Use: No  . Drug Use: No  . Sexual Activity: Yes    Birth Control/ Protection: Surgical   Other Topics Concern  . None   Social History Narrative    Outpatient Encounter Prescriptions as of 08/30/2014  Medication Sig  . ascorbic acid (VITAMIN C) 500 MG tablet Take 500 mg by mouth 2 (two) times daily.   . AZOR 5-40 MG per tablet TAKE 1 TABLET BY MOUTH EVERY MORNING.  . Calcium Carbonate-Vitamin D (CALCIUM-VITAMIN D) 500-200 MG-UNIT per tablet Take 1 tablet by  mouth 2 (two) times daily with a meal.    . clopidogrel (PLAVIX) 75 MG tablet TAKE 1 TABLET (75 MG TOTAL) BY MOUTH AT BEDTIME.  [name] glucose blood (ACCU-CHEK AVIVA PLUS) test strip Use to check blood sugar once a day. Dx 250.00  . simvastatin (ZOCOR) 10 MG tablet TAKE 1/2 TABLETS BY MOUTH AT BEDTIME.  [name] sulfamethoxazole-trimethoprim (SEPTRA DS) 800-160 MG per tablet Take 1 tablet by mouth 2 (two) times daily.    EXAM:  BP 110/70 mmHg  Pulse 81  Temp(Src) 98.2 F (36.8 C) (Oral)  Ht 5\' 5"  (1.651 m)  Wt 142 lb 8 oz (64.638 kg)  BMI 23.71 kg/m2  SpO2 97%  Body mass index is 23.71 kg/(m^2).  GENERAL: vitals reviewed and listed above, alert, oriented, appears well hydrated and in no acute distress HEENT: atraumatic, conjunctiva  clear, no obvious abnormalities on inspection of external nose and ears NECK: no obvious masses on inspection palpation  CV: HRRR, no clubbing cyanosis or  peripheral edema nl cap refill  Abdomen:  Sof,t normal bowel sounds without hepatosplenomegaly, no guarding rebound or masses no CVA tenderness mild suprapubic tenderness no mass MS: moves  all extremities without noticeable focal  abnormality PSYCH: pleasant and cooperative, no obvious depression or anxiety UA trc leuk  Past cx neg for low count ecoli  ASSESSMENT AND PLAN:  Discussed the following assessment and plan:  Painful urination - Plan: POCT Urinalysis Dipstick  Frequency of urination - Plan: POCT Urinalysis Dipstick, CULTURE, URINE COMPREHENSIVE  Recurrent UTI  Recurrent cystitis Empiric rx  Early sx  ucx pending -Patient advised to return or notify health care team  if symptoms worsen ,persist or new concerns arise.  Patient Instructions  Culture  results pending. Can begin antibiotic if needed.  Urinary Tract Infection Urinary tract infections (UTIs) can develop anywhere along your urinary tract. Your urinary tract is your body's drainage system for removing wastes and extra water. Your  urinary tract includes two kidneys, two ureters, a bladder, and a urethra. Your kidneys are a pair of bean-shaped organs. Each kidney is about the size of your fist. They are located below your ribs, one on each side of your spine. CAUSES Infections are caused by microbes, which are microscopic organisms, including fungi, viruses, and bacteria. These organisms are so small that they can only be seen through a microscope. Bacteria are the microbes that most commonly cause UTIs. SYMPTOMS  Symptoms of UTIs may vary by age and gender of the patient and by the location of the infection. Symptoms in young women typically include a frequent and intense urge to urinate and a painful, burning feeling in the bladder or urethra during urination. Older women and men are more likely to be tired, shaky, and weak and have muscle aches and abdominal pain. A fever may mean the infection is in your kidneys. Other symptoms of a kidney infection include pain in your back or sides below the ribs, nausea, and vomiting. DIAGNOSIS To diagnose a UTI, your caregiver will ask you about your symptoms. Your caregiver also will ask to provide a urine sample. The urine sample will be tested for bacteria and white blood cells. White blood cells are made by your body to help fight infection. TREATMENT  Typically, UTIs can be treated with medication. Because most UTIs are caused by a bacterial infection, they usually can be treated with the use of antibiotics. The choice of antibiotic and length of treatment depend on your symptoms and the type of bacteria causing your infection. HOME CARE INSTRUCTIONS  If you were prescribed antibiotics, take them exactly as your caregiver instructs you. Finish the medication even if you feel better after you have only taken some of the medication.  Drink enough water and fluids to keep your urine clear or pale yellow.  Avoid caffeine, tea, and carbonated beverages. They tend to irritate your  bladder.  Empty your bladder often. Avoid holding urine for long periods of time.  Empty your bladder before and after sexual intercourse.  After a bowel movement, women should cleanse from front to back. Use each tissue only once. SEEK MEDICAL CARE IF:   You have back pain.  You develop a fever.  Your symptoms do not begin to resolve within 3 days. SEEK IMMEDIATE MEDICAL CARE IF:   You have severe back pain or lower abdominal pain.  You develop chills.  You have nausea or vomiting.  You have continued burning or discomfort with urination. MAKE SURE YOU:   Understand these instructions.  Will watch your condition.  Will get help right away if you are not doing well or get worse. Document Released: 04/06/2005 Document Revised: 12/27/2011  Document Reviewed: 08/05/2011 [facility] Patient Information 2015 [place], [place]. This information is not intended to replace advice given to you by your health care provider. Make sure you discuss any questions you have with your health care provider.      Standley Brooking. Panosh M.D.

## 2014-09-17 ENCOUNTER — Ambulatory Visit (INDEPENDENT_AMBULATORY_CARE_PROVIDER_SITE_OTHER): Payer: Medicare Other | Admitting: Internal Medicine

## 2014-09-17 ENCOUNTER — Encounter: Payer: Self-pay | Admitting: Internal Medicine

## 2014-09-17 ENCOUNTER — Other Ambulatory Visit (INDEPENDENT_AMBULATORY_CARE_PROVIDER_SITE_OTHER): Payer: Medicare Other

## 2014-09-17 VITALS — BP 146/78 | HR 80 | Temp 97.7°F | Resp 16 | Wt 141.0 lb

## 2014-09-17 DIAGNOSIS — R7309 Other abnormal glucose: Secondary | ICD-10-CM

## 2014-09-17 DIAGNOSIS — Z0000 Encounter for general adult medical examination without abnormal findings: Secondary | ICD-10-CM

## 2014-09-17 DIAGNOSIS — I6350 Cerebral infarction due to unspecified occlusion or stenosis of unspecified cerebral artery: Secondary | ICD-10-CM

## 2014-09-17 DIAGNOSIS — I639 Cerebral infarction, unspecified: Secondary | ICD-10-CM | POA: Diagnosis not present

## 2014-09-17 DIAGNOSIS — R7303 Prediabetes: Secondary | ICD-10-CM

## 2014-09-17 DIAGNOSIS — E785 Hyperlipidemia, unspecified: Secondary | ICD-10-CM

## 2014-09-17 LAB — LIPID PANEL
Cholesterol: 154 mg/dL (ref 0–200)
HDL: 40.7 mg/dL (ref >39.00)
LDL CALC: 86 mg/dL (ref 0–99)
NonHDL: 113.3
Total CHOL/HDL Ratio: 4
Triglycerides: 138 mg/dL (ref 0.0–149.0)
VLDL: 27.6 mg/dL (ref 0.0–40.0)

## 2014-09-17 LAB — HEMOGLOBIN A1C: Hgb A1c MFr Bld: 6.4 % (ref 4.6–6.5)

## 2014-09-17 MED ORDER — AMLODIPINE-OLMESARTAN 5-40 MG PO TABS: 1.0000 | ORAL_TABLET | Freq: Every morning | ORAL | Status: DC

## 2014-09-17 MED ORDER — SIMVASTATIN 10 MG PO TABS: 5.0000 mg | ORAL_TABLET | Freq: Every day | ORAL | Status: DC

## 2014-09-17 MED ORDER — CLOPIDOGREL BISULFATE 75 MG PO TABS: 75.0000 mg | ORAL_TABLET | Freq: Every day | ORAL | Status: DC

## 2014-09-17 NOTE — Progress Notes (Signed)
Pre visit review using our clinic review tool, if applicable. No additional management support is needed unless otherwise documented below in the visit note. 

## 2014-09-17 NOTE — Assessment & Plan Note (Signed)
On statin - pt expects labs to be checked every 3-6 months - continued to reassure she is approp for annual testing... but will check labs as requested

## 2014-09-17 NOTE — Assessment & Plan Note (Signed)
Strong FH - controls self with diet -  status post nutrition education in 2011 with maggie at [facility] recheck a1c now Lab Results  Component Value Date   HGBA1C 6.5 06/10/2014

## 2014-09-17 NOTE — Patient Instructions (Signed)
It was good to see you today.  We have reviewed your prior records including labs and tests today  Test(s) ordered today. Your results will be released to [place] (or called to you) after review, usually within 72hours after test completion. If any changes need to be made, you will be notified at that same time.  Medications reviewed and updated, no changes recommended at this time.Refill on medication(s) as discussed today.  we'll make referral to neurology in our for second opinion. Our office will contact you regarding appointment(s) once made.  Please schedule followup in 6 months, call sooner if problems.

## 2014-09-17 NOTE — Assessment & Plan Note (Addendum)
TIA 07/2006 at [place] thalamic - "?bleeder" per pt recollection, not on report In 2012 stopped ASA 81 and continued only plavix  - remains concerned about bleeding risk - Reviewed pro/con of various antiplt therapy - Given co-morbid risk for CVA recurrence, I advise continued Plavix tx we reviewed potential risk/benefit and possible side effects - pt understands and agrees to same Will refer to neurologist for second opinion at patient request

## 2014-09-17 NOTE — Progress Notes (Signed)
Subjective:    Patient ID: Kristen Rivas, female    DOB: May 25, 1935, 80 y.o.   MRN: [id number]  HPI   Here for medicare wellness  Diet: heart healthy, diabetic Physical activity: sedentary Depression/mood screen: negative Hearing: intact to whispered voice Visual acuity: grossly normal, performs annual eye exam  ADLs: capable Fall risk: none Home safety: good Cognitive evaluation: intact to orientation, naming, recall and repetition EOL planning: adv directives, full code/ I agree  I have personally reviewed and have noted 1. The patient's medical and social history 2. Their use of alcohol, tobacco or illicit drugs 3. Their current medications and supplements 4. The patient's functional ability including ADL's, fall risks, home safety risks and hearing or visual impairment. 5. Diet and physical activities 6. Evidence for depression or mood disorders  Also reviewed chronic medical issues and interval events  Past Medical History  Diagnosis Date  . CARDIAC ARRHYTHMIA   . CVA 07/2006    right thalamic CVA  . Palpitations   . Anxiety state, unspecified   . DIABETES MELLITUS, TYPE II   . HYPERTENSION   . DYSLIPIDEMIA    Family History  Problem Relation Age of Onset  . Arthritis Mother   . Colon cancer Sister 69  . Lung cancer Sister     non smoker  . Heart disease Brother   . Lung cancer Brother     X3, all smokers  . Diabetes Other     diabetes  . Arthritis Other   . Stroke Other     parent &  M grandfather   History  Substance Use Topics  . Smoking status: Never Smoker   . Smokeless tobacco: Not on file     Comment: Divorced, lives alone. Retired  . Alcohol Use: No    Review of Systems  Constitutional: Negative for fatigue and unexpected weight change.  HENT: Positive for postnasal drip. Negative for congestion, ear pain, hearing loss, sinus pressure, trouble swallowing and voice change.   Eyes: Negative for pain and visual disturbance.  Respiratory:  Negative for cough, shortness of breath and wheezing.   Cardiovascular: Negative for chest pain, palpitations and leg swelling.  Gastrointestinal: Negative for nausea, abdominal pain and diarrhea.  Neurological: Positive for dizziness (this AM since waking, currently improved). Negative for tremors, seizures, syncope, speech difficulty, weakness, light-headedness, numbness and headaches.  Psychiatric/Behavioral: Negative for dysphoric mood. The patient is not nervous/anxious.   All other systems reviewed and are negative.      Objective:    Physical Exam  Constitutional: She is oriented to person, place, and time. She appears well-developed and well-nourished. No distress.  HENT:  Head: Normocephalic and atraumatic.  Right Ear: External ear normal.  Left Ear: External ear normal.  Nose: Nose normal.  Mouth/Throat: Oropharynx is clear and moist. No oropharyngeal exudate.  Eyes: Conjunctivae and EOM are normal. Pupils are equal, round, and reactive to light. Right eye exhibits no discharge. Left eye exhibits no discharge. No scleral icterus.  Neck: Normal range of motion. Neck supple. No JVD present. No tracheal deviation present. No thyromegaly present.  Cardiovascular: Normal rate, regular rhythm, normal heart sounds and intact distal pulses.  Exam reveals no friction rub.   No murmur heard. Pulmonary/Chest: Effort normal and breath sounds normal. No respiratory distress. She has no wheezes. She has no rales. She exhibits no tenderness.  Abdominal: Soft. Bowel sounds are normal. She exhibits no distension and no mass. There is no tenderness. There is no rebound  and no guarding.  Genitourinary:  Defer to gyn  Musculoskeletal: Normal range of motion.  No gross deformities  Lymphadenopathy:    She has no cervical adenopathy.  Neurological: She is alert and oriented to person, place, and time. She has normal reflexes. She displays normal reflexes. No cranial nerve deficit. She exhibits  normal muscle tone. Coordination normal.  Skin: Skin is warm and dry. No rash noted. She is not diaphoretic. No erythema.  Psychiatric: She has a normal mood and affect. Her behavior is normal. Judgment and thought content normal.  Nursing note and vitals reviewed.   BP 146/78 mmHg  Pulse 80  Temp(Src) 97.7 F (36.5 C) (Oral)  Resp 16  Wt 141 lb (63.957 kg)  SpO2 96% Wt Readings from Last 3 Encounters:  09/17/14 141 lb (63.957 kg)  08/30/14 142 lb 8 oz (64.638 kg)  06/10/14 140 lb (63.504 kg)    Lab Results  Component Value Date   WBC 6.9 11/15/2013   HGB 13.4 11/15/2013   HCT 39.9 11/15/2013   PLT 222.0 11/15/2013   GLUCOSE 105* 06/10/2014   CHOL 154 09/17/2014   TRIG 138.0 09/17/2014   HDL 40.70 09/17/2014   LDLCALC 86 09/17/2014   ALT 19 06/10/2014   AST 23 06/10/2014   NA 141 06/10/2014   K 4.9 06/10/2014   CL 105 06/10/2014   CREATININE 0.6 06/10/2014   BUN 16 06/10/2014   CO2 29 06/10/2014   TSH 1.02 11/15/2013   HGBA1C 6.4 09/17/2014   MICROALBUR 0.6 11/15/2013    Dg Bone Density  03/02/2014   Findings : lowest T score - 1.6 @  femoral neck FRAX risk @ hip / spine : not valid due to HRT Diagnosis:  Osteopenia  See Recommendations      Assessment & Plan:   Z00.00/AWV - Today patient counseled on age appropriate routine health concerns for screening and prevention, each reviewed and up to date or declined. Immunizations reviewed and up to date or declined. Labs ordered and reviewed. Risk factors for depression reviewed and negative. Hearing function and visual acuity are intact. ADLs screened and addressed as needed. Functional ability and level of safety reviewed and appropriate. Education, counseling and referrals performed based on assessed risks today. Patient provided with a copy of personalized plan for preventive services.  Problem List Items Addressed This Visit    Cerebral artery occlusion with cerebral infarction - Primary    TIA 07/2006 at [place] thalamic - "?bleeder" per pt recollection, not on report In 2012 stopped ASA 81 and continued only plavix  - remains concerned about bleeding risk - Reviewed pro/con of various antiplt therapy - Given co-morbid risk for CVA recurrence, I advise continued Plavix tx we reviewed potential risk/benefit and possible side effects - pt understands and agrees to same Will refer to neurologist for second opinion at patient request      Relevant Medications   amLODipine-olmesartan (AZOR) 5-40 MG per tablet   simvastatin (ZOCOR) tablet   Other Relevant Orders   Ambulatory referral to Neurology   Hyperlipidemia    On statin - pt expects labs to be checked every 3-6 months - continued to reassure she is approp for annual testing... but will check labs as requested       Relevant Medications   amLODipine-olmesartan (AZOR) 5-40 MG per tablet   simvastatin (ZOCOR) tablet   Other Relevant Orders   Lipid panel (Completed)   Pre-diabetes    Strong FH -  controls self with diet -  status post nutrition education in 2011 with maggie at [facility] mgmt center recheck a1c now Lab Results  Component Value Date   HGBA1C 6.5 06/10/2014        Relevant Orders   Hemoglobin A1c (Completed)       Gwendolyn Grant, MD

## 2014-09-18 ENCOUNTER — Telehealth: Payer: Self-pay | Admitting: Internal Medicine

## 2014-09-18 NOTE — Telephone Encounter (Signed)
Pt must have spoke with student Lovena Le. Pt hasn't been seen by Terri Piedra before.

## 2014-09-18 NOTE — Telephone Encounter (Signed)
Patient would like for Lovena Le to return her call concerning blood pressure.

## 2014-09-19 NOTE — Telephone Encounter (Signed)
Called Kristen Rivas and stated that she has been monitoring BP at home and is still having htn even during time of rest. Average readings are around 150-160/90. Kristen Rivas is also noticing head dizziness and swirling along with a slight head ache. This am after waking it was 150's but after taking medication (after 12) is was in the 140's.   Kristen Rivas wanted to know if Kristen Rivas could take another 1/2 Azor. Spoke to Dr. Doug Sou and she said that 40mg  of olmesartan was the max dose. Advise per MD that Kristen Rivas should monitor BP readings and I advise if any additional sx like SOB or pain in chest or any other unexplainable pain occurs to seek immediate attention.   Please advise what Kristen Rivas should do or if there are any med changes.

## 2014-09-19 NOTE — Telephone Encounter (Signed)
Pt called back in and has a few question for nurse.  Requesting call back today

## 2014-09-21 MED ORDER — HYDROCHLOROTHIAZIDE 25 MG PO TABS: 25.0000 mg | ORAL_TABLET | Freq: Every day | ORAL | Status: DC

## 2014-09-21 NOTE — Telephone Encounter (Signed)
Begin HCTZ 25 qd in addition to ongoing Azor - new erx done OV for acute if continued problems

## 2014-09-22 NOTE — Telephone Encounter (Signed)
Pt informed of PCP response below.  

## 2014-09-29 DIAGNOSIS — N8320 Unspecified ovarian cysts: Secondary | ICD-10-CM | POA: Diagnosis not present

## 2014-10-17 DIAGNOSIS — Z85828 Personal history of other malignant neoplasm of skin: Secondary | ICD-10-CM | POA: Diagnosis not present

## 2014-10-17 DIAGNOSIS — D1801 Hemangioma of skin and subcutaneous tissue: Secondary | ICD-10-CM | POA: Diagnosis not present

## 2014-10-17 DIAGNOSIS — L820 Inflamed seborrheic keratosis: Secondary | ICD-10-CM | POA: Diagnosis not present

## 2014-10-17 DIAGNOSIS — Z808 Family history of malignant neoplasm of other organs or systems: Secondary | ICD-10-CM | POA: Diagnosis not present

## 2014-10-17 DIAGNOSIS — D485 Neoplasm of uncertain behavior of skin: Secondary | ICD-10-CM | POA: Diagnosis not present

## 2014-10-17 DIAGNOSIS — L821 Other seborrheic keratosis: Secondary | ICD-10-CM | POA: Diagnosis not present

## 2014-10-20 DIAGNOSIS — L820 Inflamed seborrheic keratosis: Secondary | ICD-10-CM | POA: Diagnosis not present

## 2014-10-28 ENCOUNTER — Encounter: Payer: Self-pay | Admitting: Internal Medicine

## 2014-10-29 ENCOUNTER — Ambulatory Visit (INDEPENDENT_AMBULATORY_CARE_PROVIDER_SITE_OTHER): Payer: Medicare Other | Admitting: Neurology

## 2014-10-29 ENCOUNTER — Encounter: Payer: Self-pay | Admitting: Neurology

## 2014-10-29 VITALS — BP 118/50 | HR 90 | Resp 16 | Ht 65.0 in | Wt 140.2 lb

## 2014-10-29 DIAGNOSIS — I6330 Cerebral infarction due to thrombosis of unspecified cerebral artery: Secondary | ICD-10-CM

## 2014-10-29 DIAGNOSIS — I639 Cerebral infarction, unspecified: Secondary | ICD-10-CM | POA: Diagnosis not present

## 2014-10-29 NOTE — Progress Notes (Signed)
NEUROLOGY CONSULTATION NOTE  Kristen Rivas MRN: [id number] DOB: 1934/08/27  Referring provider: Dr. Asa Lente Primary care provider: Dr. Asa Lente  Reason for consult:  History of stroke and opinion regarding antiplatelet therapy.  HISTORY OF PRESENT ILLNESS: Kristen Rivas is a 80 year old right-handed female with hyperlipidemia and hypertension who presents for opinion regarding anti-platelet therapy due to prior stroke.  In January 2008, she was admitted to [facility] for acute right thalamic infarct, as demonstrated on MRI.  She had experienced sudden onset of numbness involving the left hand which spread up the arm and left side of her neck.  She was not on any medications at that time.  MRA of head showed no intracranial stenosis.  Carotid doppler showed no hemodynamically significant ICA stenosis.  She was started on ASA 81mg  daily, Plavix and simvastatin.  She says she was told that she had a small bleed in her brain.  She followed up with the neurologist later in the year, who told her she only needed to be on the Plavix for 3 months following the stroke.  However, her PCP at the time, told her to remain on the ASA and Plavix.  A couple of years ago, she stopped the ASA but was told to continue Plavix.  She is wondering if she needs to remain on Plavix, particularly since she was told that her stroke was a bleed.  In March, Hgb A1c was 6.4, cholesterol 154, HDL 40.70 and LDL 86.  PAST MEDICAL HISTORY: Past Medical History  Diagnosis Date  . CARDIAC ARRHYTHMIA   . CVA 07/2006    right thalamic CVA  . Palpitations   . Anxiety state, unspecified   . DIABETES MELLITUS, TYPE II   . HYPERTENSION   . DYSLIPIDEMIA     PAST SURGICAL HISTORY: Past Surgical History  Procedure Laterality Date  . Abdominal hysterectomy  1974  . Cholecystectomy  1981  . Back surgery      4-5 lumbar inner body fusion  . Hemorrhoid surgery      x's 2 1961 & 1964  . Breast enhancement surgery      . Colonoscopy      tics    MEDICATIONS: Current Outpatient Prescriptions on File Prior to Visit  Medication Sig Dispense Refill  . amLODipine-olmesartan (AZOR) 5-40 MG per tablet Take 1 tablet by mouth every morning. 90 tablet 3  . ascorbic acid (VITAMIN C) 500 MG tablet Take 500 mg by mouth 2 (two) times daily.     . Calcium Carbonate-Vitamin D (CALCIUM-VITAMIN D) 500-200 MG-UNIT per tablet Take 1 tablet by mouth 2 (two) times daily with a meal.      . clopidogrel (PLAVIX) 75 MG tablet Take 1 tablet (75 mg total) by mouth daily. 90 tablet 3  . glucose blood (ACCU-CHEK AVIVA PLUS) test strip Use to check blood sugar once a day. Dx 250.00 100 each 3  . hydrochlorothiazide (HYDRODIURIL) 25 MG tablet Take 1 tablet (25 mg total) by mouth daily. 30 tablet 11  . simvastatin (ZOCOR) 10 MG tablet Take 0.5 tablets (5 mg total) by mouth daily at 6 PM. 45 tablet 3  . [DISCONTINUED] Glucose Blood (ACCU-CHEK AVIVA VI) 1 each as directed.       No current facility-administered medications on file prior to visit.    ALLERGIES: Allergies  Allergen Reactions  . Penicillins Rash  . Terramycin Rash    FAMILY HISTORY: Family History  Problem Relation Age of Onset  . Arthritis  Mother   . Colon cancer Sister 76  . Lung cancer Sister     non smoker  . Heart disease Brother   . Lung cancer Brother     X3, all smokers  . Diabetes Other     diabetes  . Arthritis Other   . Stroke Other     parent &  M grandfather  . Aneurysm Brother     SOCIAL HISTORY: History   Social History  . Marital Status: Divorced    Spouse Name: N/A  . Number of Children: N/A  . Years of Education: N/A   Occupational History  . Not on file.   Social History Main Topics  . Smoking status: Never Smoker   . Smokeless tobacco: Never Used     Comment: Divorced, lives alone. Retired  . Alcohol Use: No  . Drug Use: No  . Sexual Activity: No   Other Topics Concern  . Not on file   Social History Narrative     REVIEW OF SYSTEMS: Constitutional: No fevers, chills, or sweats, no generalized fatigue, change in appetite Eyes: No visual changes, double vision, eye pain Ear, nose and throat: No hearing loss, ear pain, nasal congestion, sore throat Cardiovascular: No chest pain, palpitations Respiratory:  No shortness of breath at rest or with exertion, wheezes GastrointestinaI: No nausea, vomiting, diarrhea, abdominal pain, fecal incontinence Genitourinary:  No dysuria, urinary retention or frequency Musculoskeletal:  No neck pain, back pain Integumentary: No rash, pruritus, skin lesions Neurological: as above Psychiatric: No depression, insomnia, anxiety Endocrine: No palpitations, fatigue, diaphoresis, mood swings, change in appetite, change in weight, increased thirst Hematologic/Lymphatic:  No anemia, purpura, petechiae. Allergic/Immunologic: no itchy/runny eyes, nasal congestion, recent allergic reactions, rashes  PHYSICAL EXAM: Filed Vitals:   10/29/14 1429  BP: 118/50  Pulse: 90  Resp: 16   General: No acute distress Head:  Normocephalic/atraumatic Eyes:  fundi unremarkable, without vessel changes, exudates, hemorrhages or papilledema. Neck: supple, no paraspinal tenderness, full range of motion Back: No paraspinal tenderness Heart: regular rate and rhythm Lungs: Clear to auscultation bilaterally. Vascular: No carotid bruits. Neurological Exam: Mental status: alert and oriented to person, place, and time, recent and remote memory intact, fund of knowledge intact, attention and concentration intact, speech fluent and not dysarthric, language intact. Cranial nerves: CN I: not tested CN II: pupils equal, round and reactive to light, visual fields intact, fundi unremarkable, without vessel changes, exudates, hemorrhages or papilledema. CN III, IV, VI:  full range of motion, no nystagmus, no ptosis CN V: facial sensation intact CN VII: upper and lower face symmetric CN VIII:  hearing intact CN IX, X: gag intact, uvula midline CN XI: sternocleidomastoid and trapezius muscles intact CN XII: tongue midline Bulk & Tone: normal, no fasciculations. Motor:  5/5 throughout Sensation:  Pinprick and vibration intact Deep Tendon Reflexes:  2+ throughout, toes downgoing Finger to nose testing:  No dysmetria Heel to shin:  No dysmetria Gait:  Normal station and stride.  Able to turn and walk in tandem. Romberg negative.  IMPRESSION: History of right lacunar thalamic infarct, secondary to small vessel disease.  I don't have her prior notes from her previous PCP, neurologist, or hospitalization.  However I have looked at the MRI from January 2008 and it in fact shows an ischemic lacunar infarct, not a hemorrhage.    PLAN: 1.  From my standpoint, I think she does not need to remain on Plavix.  Instead, she should continue ASA 81mg  daily.  She was  not on any antiplatelet agent prior to her stroke.  So the reasonable secondary stroke management would be ASA and not Plavix.  Some studies have shown improved outcome if on dual anti-platelet therapy for 3 months following a TIA, but she should not have remained on the Plavix. 2.  Her LDL is at goal (less than 100).  Whether she in fact needs to remain on simvastatin, I defer to Dr. Asa Lente.    No follow up is warranted.  Thank you for allowing me to take part in the care of this patient.  Metta Clines, DO  CC:  Gwendolyn Grant, MD

## 2014-10-29 NOTE — Patient Instructions (Signed)
From my standpoint, you do NOT need to be on Plavix.  Instead, I would remain on aspirin 81mg  daily for secondary stroke prevention. Your stroke from 2008 was due to a blood clot and NOT a bleed. Your cholesterol looks okay.  Your LDL from last month was 86 and your goal should be less than 100.  Whether you need to remain on simvastatin should be discussed with Dr. Asa Lente.

## 2014-11-06 ENCOUNTER — Telehealth: Payer: Self-pay | Admitting: Internal Medicine

## 2014-11-06 DIAGNOSIS — G459 Transient cerebral ischemic attack, unspecified: Secondary | ICD-10-CM

## 2014-11-06 NOTE — Telephone Encounter (Signed)
LVM for pt to call back.   RE: needed a dx code for entering referral.

## 2014-11-06 NOTE — Telephone Encounter (Signed)
Yes, Dr. Asa Lente would need to enter a referral.

## 2014-11-06 NOTE — Telephone Encounter (Signed)
Patient has an appointment with Dr. Wallene Huh (he is a neurologist in sleep study with [facility] in [place] [phone]) on June 7th.  She has seen him before but has not seen him in a few years for TIA.  Provider is requesting a referral from Dr. Asa Lente.

## 2014-11-06 NOTE — Telephone Encounter (Signed)
Would a referral be needed?

## 2014-11-07 NOTE — Telephone Encounter (Signed)
Use dx for TIA, refer to neuro, details as listed below thanks

## 2014-11-07 NOTE — Telephone Encounter (Signed)
Referral has been entered...Kristen Rivas

## 2014-12-16 ENCOUNTER — Encounter: Payer: Self-pay | Admitting: Neurology

## 2014-12-16 DIAGNOSIS — I10 Essential (primary) hypertension: Secondary | ICD-10-CM | POA: Diagnosis not present

## 2014-12-16 DIAGNOSIS — I6990 Unspecified sequelae of unspecified cerebrovascular disease: Secondary | ICD-10-CM | POA: Diagnosis not present

## 2014-12-16 DIAGNOSIS — E785 Hyperlipidemia, unspecified: Secondary | ICD-10-CM | POA: Diagnosis not present

## 2014-12-16 DIAGNOSIS — R233 Spontaneous ecchymoses: Secondary | ICD-10-CM | POA: Diagnosis not present

## 2014-12-16 NOTE — Progress Notes (Signed)
Previous CVA (stroke) with residual effects   ICD10: I69.90  Right thalamic infarction -2008 with L facial and LUE numbness.   Hypertensive disorder ICD10: I10   Hyperlipidemia ICD10: E78.5    I discussed extensively with the patient regarding the issue of her antiplatelet agents. The patient should be on a single antiplatelet agent. Either aspirin or Plavix is perfectly fine. It appears she is needing towards aspirin and she's had some petechial hemorrhages an easy bruising from the Plavix. Recommend the aspirin 81 mg 2 tablets a day. She'll continue with the other risk factor modifications including the azor or for blood pressure and simvastatin. Exercise and consumption red grape juice is also recommended.

## 2015-03-09 ENCOUNTER — Other Ambulatory Visit: Payer: Self-pay

## 2015-03-09 DIAGNOSIS — Z1231 Encounter for screening mammogram for malignant neoplasm of breast: Secondary | ICD-10-CM

## 2015-03-13 DIAGNOSIS — M545 Low back pain: Secondary | ICD-10-CM | POA: Diagnosis not present

## 2015-03-23 ENCOUNTER — Other Ambulatory Visit (INDEPENDENT_AMBULATORY_CARE_PROVIDER_SITE_OTHER): Payer: Medicare Other

## 2015-03-23 ENCOUNTER — Ambulatory Visit (INDEPENDENT_AMBULATORY_CARE_PROVIDER_SITE_OTHER): Payer: Medicare Other | Admitting: Internal Medicine

## 2015-03-23 ENCOUNTER — Encounter: Payer: Self-pay | Admitting: Internal Medicine

## 2015-03-23 VITALS — BP 128/70 | HR 77 | Temp 97.5°F | Ht 65.0 in | Wt 138.0 lb

## 2015-03-23 DIAGNOSIS — N8320 Unspecified ovarian cysts: Secondary | ICD-10-CM | POA: Diagnosis not present

## 2015-03-23 DIAGNOSIS — Z800 Family history of malignant neoplasm of digestive organs: Secondary | ICD-10-CM

## 2015-03-23 DIAGNOSIS — M4854XD Collapsed vertebra, not elsewhere classified, thoracic region, subsequent encounter for fracture with routine healing: Secondary | ICD-10-CM | POA: Diagnosis not present

## 2015-03-23 DIAGNOSIS — R739 Hyperglycemia, unspecified: Secondary | ICD-10-CM | POA: Diagnosis not present

## 2015-03-23 DIAGNOSIS — S22080D Wedge compression fracture of T11-T12 vertebra, subsequent encounter for fracture with routine healing: Secondary | ICD-10-CM

## 2015-03-23 DIAGNOSIS — M8580 Other specified disorders of bone density and structure, unspecified site: Secondary | ICD-10-CM | POA: Diagnosis not present

## 2015-03-23 DIAGNOSIS — I639 Cerebral infarction, unspecified: Secondary | ICD-10-CM | POA: Diagnosis not present

## 2015-03-23 DIAGNOSIS — I6350 Cerebral infarction due to unspecified occlusion or stenosis of unspecified cerebral artery: Secondary | ICD-10-CM

## 2015-03-23 DIAGNOSIS — Z23 Encounter for immunization: Secondary | ICD-10-CM

## 2015-03-23 DIAGNOSIS — R7303 Prediabetes: Secondary | ICD-10-CM

## 2015-03-23 DIAGNOSIS — R7309 Other abnormal glucose: Secondary | ICD-10-CM

## 2015-03-23 LAB — MICROALBUMIN / CREATININE URINE RATIO
Creatinine,U: 42.1 mg/dL
MICROALB/CREAT RATIO: 1.7 mg/g (ref 0.0–30.0)
Microalb, Ur: <0.7 mg/dL (ref 0.0–1.9)

## 2015-03-23 LAB — HEMOGLOBIN A1C: Hgb A1c MFr Bld: 6.1 % (ref 4.6–6.5)

## 2015-03-23 MED ORDER — SIMVASTATIN 10 MG PO TABS: 5.0000 mg | ORAL_TABLET | Freq: Every day | ORAL | Status: DC

## 2015-03-23 MED ORDER — CALCIUM CARB-CHOLECALCIFEROL 600-800 MG-UNIT PO TABS: 1.0000 | ORAL_TABLET | Freq: Two times a day (BID) | ORAL | Status: AC

## 2015-03-23 NOTE — Progress Notes (Signed)
Subjective:    Patient ID: Kristen Rivas, female    DOB: 06-Oct-1934, 80 y.o.   MRN: [id number]  HPI  Patient here for follow up - Also reviewed chronic medical conditions, interval events and current concerns. Recent UC eval last week for back pain after overexertion, tx with IM tordol and reportedly with T12 compression fx on plain film  Past Medical History  Diagnosis Date  . CARDIAC ARRHYTHMIA   . CVA 07/2006    right thalamic CVA  . Palpitations   . Anxiety state, unspecified   . DIABETES MELLITUS, TYPE II   . HYPERTENSION   . DYSLIPIDEMIA     Review of Systems  Constitutional: Positive for fatigue. Negative for unexpected weight change.  Respiratory: Negative for cough and shortness of breath.   Cardiovascular: Negative for chest pain and leg swelling.  Musculoskeletal: Positive for back pain (upper T location with housework).       Objective:    Physical Exam  Constitutional: She appears well-developed and well-nourished. No distress.  Cardiovascular: Normal rate, regular rhythm and normal heart sounds.   No murmur heard. Pulmonary/Chest: Effort normal and breath sounds normal. No respiratory distress.  Musculoskeletal: She exhibits no edema.   BP 128/70 mmHg  Pulse 77  Temp(Src) 97.5 F (36.4 C) (Oral)  Ht 5\' 5"  (1.651 m)  Wt 138 lb (62.596 kg)  BMI 22.96 kg/m2  SpO2 93% Wt Readings from Last 3 Encounters:  03/23/15 138 lb (62.596 kg)  10/29/14 140 lb 3.2 oz (63.594 kg)  09/17/14 141 lb (63.957 kg)    Lab Results  Component Value Date   WBC 6.9 11/15/2013   HGB 13.4 11/15/2013   HCT 39.9 11/15/2013   PLT 222.0 11/15/2013   GLUCOSE 105* 06/10/2014   CHOL 154 09/17/2014   TRIG 138.0 09/17/2014   HDL 40.70 09/17/2014   LDLCALC 86 09/17/2014   ALT 19 06/10/2014   AST 23 06/10/2014   NA 141 06/10/2014   K 4.9 06/10/2014   CL 105 06/10/2014   CREATININE 0.6 06/10/2014   BUN 16 06/10/2014   CO2 29 06/10/2014   TSH 1.02 11/15/2013   HGBA1C 6.4  09/17/2014   MICROALBUR 0.6 11/15/2013    Dg Bone Density  03/02/2014   Findings : lowest T score - 1.6 @  femoral neck FRAX risk @ hip / spine : not valid due to HRT Diagnosis:  Osteopenia  See Recommendations      Assessment & Plan:   Problem List Items Addressed This Visit    Cerebral artery occlusion with cerebral infarction    Neuro event in 07/2006 at [facility] - "?bleeder" per pt recollection (not on report) In 2012 stopped ASA 81 and continued only plavix  -  Pt with high concern about pro/con of various antiplt therapy and given co-morbid risk for CVA recurrence, pt arranged two "second opinions" from neurologist re: Plavix and ASA -  Since 01/2015, taking 2 -81mg  ASA qhs and low low dose simva      Relevant Medications   aspirin 81 MG tablet   simvastatin (ZOCOR) 10 MG tablet   Osteopenia - Primary    Reports T12 fx on plain film at UC eval for back pain Refer to ortho specialist for 2nd opinion on management of same      Relevant Orders   Ambulatory referral to Orthopedic Surgery   Pre-diabetes    Strong FH - controls self with diet -  status post  nutrition education in 2011 with maggie at [facility] recheck a1c now Lab Results  Component Value Date   HGBA1C 6.4 09/17/2014         Other Visit Diagnoses    T12 compression fracture, with routine healing, subsequent encounter        Relevant Orders    Ambulatory referral to Orthopedic Surgery    Family history of colon cancer        Relevant Orders    Ambulatory referral to Gastroenterology    Hyperglycemia        Relevant Orders    Hemoglobin A1c    Microalbumin / creatinine urine ratio        Gwendolyn Grant, MD

## 2015-03-23 NOTE — Assessment & Plan Note (Signed)
Neuro event in 07/2006 at [facility] - "?bleeder" per pt recollection (not on report) In 2012 stopped ASA 81 and continued only plavix  -  Pt with high concern about pro/con of various antiplt therapy and given co-morbid risk for CVA recurrence, pt arranged two "second opinions" from neurologist re: Plavix and ASA -  Since 01/2015, taking 2 -81mg  ASA qhs and low low dose simva

## 2015-03-23 NOTE — Assessment & Plan Note (Signed)
Reports T12 fx on plain film at UC eval for back pain Refer to ortho specialist for 2nd opinion on management of same

## 2015-03-23 NOTE — Progress Notes (Signed)
Pre visit review using our clinic review tool, if applicable. No additional management support is needed unless otherwise documented below in the visit note. 

## 2015-03-23 NOTE — Patient Instructions (Signed)
It was good to see you today.  We have reviewed your prior records including labs and tests today  Test(s) ordered today. Your results will be released to [place] (or called to you) after review, usually within 72hours after test completion. If any changes need to be made, you will be notified at that same time.  Medications reviewed and updated, no changes recommended at this time. Refill on medication(s) as discussed today.  we'll make referral to ortho specialist for spine evaluation and to Dr. Carlean Purl for opinion on timing of repeat colonoscopy . Our office will contact you regarding appointment(s) once made.  Please schedule followup in 3-4 months, call sooner if problems.

## 2015-03-23 NOTE — Assessment & Plan Note (Signed)
Strong FH - controls self with diet -  status post nutrition education in 2011 with maggie at [facility] center recheck a1c now Lab Results  Component Value Date   HGBA1C 6.4 09/17/2014

## 2015-03-30 ENCOUNTER — Ambulatory Visit (INDEPENDENT_AMBULATORY_CARE_PROVIDER_SITE_OTHER): Payer: Medicare Other

## 2015-03-30 ENCOUNTER — Ambulatory Visit (AMBULATORY_SURGERY_CENTER): Payer: Self-pay

## 2015-03-30 VITALS — Ht 66.0 in | Wt 140.0 lb

## 2015-03-30 DIAGNOSIS — Z23 Encounter for immunization: Secondary | ICD-10-CM | POA: Diagnosis not present

## 2015-03-30 DIAGNOSIS — Z800 Family history of malignant neoplasm of digestive organs: Secondary | ICD-10-CM

## 2015-03-30 NOTE — Progress Notes (Signed)
No allergies to eggs or soy No diet/weight loss meds No home oxygen No past problems with anesthesia (easily sedated)  [place] emmi[place], internet not available

## 2015-04-03 ENCOUNTER — Other Ambulatory Visit: Payer: Self-pay | Admitting: Sports Medicine

## 2015-04-03 DIAGNOSIS — R5383 Other fatigue: Secondary | ICD-10-CM | POA: Diagnosis not present

## 2015-04-03 DIAGNOSIS — M8580 Other specified disorders of bone density and structure, unspecified site: Secondary | ICD-10-CM | POA: Diagnosis not present

## 2015-04-03 DIAGNOSIS — S22080A Wedge compression fracture of T11-T12 vertebra, initial encounter for closed fracture: Secondary | ICD-10-CM | POA: Diagnosis not present

## 2015-04-03 DIAGNOSIS — M4854XA Collapsed vertebra, not elsewhere classified, thoracic region, initial encounter for fracture: Secondary | ICD-10-CM | POA: Diagnosis not present

## 2015-04-03 DIAGNOSIS — E559 Vitamin D deficiency, unspecified: Secondary | ICD-10-CM | POA: Diagnosis not present

## 2015-04-03 DIAGNOSIS — M545 Low back pain: Secondary | ICD-10-CM | POA: Diagnosis not present

## 2015-04-06 ENCOUNTER — Ambulatory Visit
Admission: RE | Admit: 2015-04-06 | Discharge: 2015-04-06 | Disposition: A | Discharge status: Home / Self Care | Payer: Medicare Other | Source: Ambulatory Visit | Attending: Sports Medicine | Admitting: Sports Medicine

## 2015-04-06 DIAGNOSIS — S22080A Wedge compression fracture of T11-T12 vertebra, initial encounter for closed fracture: Secondary | ICD-10-CM

## 2015-04-06 DIAGNOSIS — M545 Low back pain: Secondary | ICD-10-CM

## 2015-04-13 ENCOUNTER — Encounter: Payer: Self-pay | Admitting: Internal Medicine

## 2015-04-13 ENCOUNTER — Ambulatory Visit (AMBULATORY_SURGERY_CENTER): Payer: Medicare Other | Admitting: Internal Medicine

## 2015-04-13 VITALS — BP 122/61 | HR 66 | Temp 98.1°F | Resp 13 | Ht 66.0 in | Wt 140.0 lb

## 2015-04-13 DIAGNOSIS — K5730 Diverticulosis of large intestine without perforation or abscess without bleeding: Secondary | ICD-10-CM | POA: Diagnosis not present

## 2015-04-13 DIAGNOSIS — E119 Type 2 diabetes mellitus without complications: Secondary | ICD-10-CM | POA: Diagnosis not present

## 2015-04-13 DIAGNOSIS — I10 Essential (primary) hypertension: Secondary | ICD-10-CM | POA: Diagnosis not present

## 2015-04-13 DIAGNOSIS — Z800 Family history of malignant neoplasm of digestive organs: Secondary | ICD-10-CM

## 2015-04-13 DIAGNOSIS — Z1211 Encounter for screening for malignant neoplasm of colon: Secondary | ICD-10-CM | POA: Diagnosis not present

## 2015-04-13 MED ORDER — SODIUM CHLORIDE 0.9 % IV SOLN: 500.0000 mL | INTRAVENOUS | Status: DC

## 2015-04-13 NOTE — Op Note (Signed)
[facility]  [address] [place], [id number]   COLONOSCOPY PROCEDURE REPORT  PATIENT: Kristen Rivas, Kristen Rivas  MR#: [id number] BIRTHDATE: 1934-08-22 , 64  yrs. old GENDER: female ENDOSCOPIST: Gatha Mayer, MD, [facility] PROCEDURE DATE:  04/13/2015 PROCEDURE: First Screening Colonoscopy - Avg.  risk and is 50 yrs.  old or older - No.  Prior Negative Screening - Now for repeat screening. Less than 10 yrs Prior Negative Screening - Now for repeat screening.  Above average risk  History of Adenoma - Now for follow-up colonoscopy & has been > or = to 3 yrs.  N/A  Polyps removed today? No Recommend repeat exam, <10 yrs? No ASA CLASS:   Class II INDICATIONS:Screening for colonic neoplasia and FH Colon or Rectal Adenocarcinoma. MEDICATIONS: Propofol 175 mg IV and Monitored anesthesia care  DESCRIPTION OF PROCEDURE:   After the risks benefits and alternatives of the procedure were thoroughly explained, informed consent was obtained.  The digital rectal exam revealed no abnormalities of the rectum.   The LB [id number]  endoscope was introduced through the anus and advanced to the cecum, which was identified by both the appendix and ileocecal valve. No adverse events experienced.   The quality of the prep was excellent. (MiraLax was used)  The instrument was then slowly withdrawn as the colon was fully examined. Estimated blood loss is zero unless otherwise noted in this procedure report.      COLON FINDINGS: There was severe diverticulosis noted in the left colon.   The examination was otherwise normal.  Retroflexed views revealed no abnormalities. The time to cecum = 3.7 Withdrawal time = 6.6   The scope was withdrawn and the procedure completed. COMPLICATIONS: There were no immediate complications.  ENDOSCOPIC IMPRESSION: 1.   Severe diverticulosis was noted in the left colon 2.   The examination was otherwise normal - excellent prep  RECOMMENDATIONS: Routine repeat  colonoscopy screening not necessary.  See me/GI as needed.  eSigned:  Gatha Mayer, MD, [facility] 04/13/2015 2:52 PM   cc: The Patient

## 2015-04-13 NOTE — Progress Notes (Signed)
Transferred to recovery room. A/O x3, pleased with MAC.  VSS.  Report to Jill, RN. 

## 2015-04-13 NOTE — Patient Instructions (Addendum)
No polyps or cancer! You do have diverticulosis - thickened muscle rings and pouches in the colon wall. Please read the handout about this condition.  No mor routine colonoscopy needed.  I appreciate the opportunity to care for you. Gatha Mayer, MD, FACG YOU HAD AN ENDOSCOPIC PROCEDURE TODAY AT [facility]:   Refer to the procedure report that was given to you for any specific questions about what was found during the examination.  If the procedure report does not answer your questions, please call your gastroenterologist to clarify.  If you requested that your care partner not be given the details of your procedure findings, then the procedure report has been included in a sealed envelope for you to review at your convenience later.  YOU SHOULD EXPECT: Some feelings of bloating in the abdomen. Passage of more gas than usual.  Walking can help get rid of the air that was put into your GI tract during the procedure and reduce the bloating. If you had a lower endoscopy (such as a colonoscopy or flexible sigmoidoscopy) you may notice spotting of blood in your stool or on the toilet paper. If you underwent a bowel prep for your procedure, you may not have a normal bowel movement for a few days.  Please Note:  You might notice some irritation and congestion in your nose or some drainage.  This is from the oxygen used during your procedure.  There is no need for concern and it should clear up in a day or so.  SYMPTOMS TO REPORT IMMEDIATELY:   Following lower endoscopy (colonoscopy or flexible sigmoidoscopy):  Excessive amounts of blood in the stool  Significant tenderness or worsening of abdominal pains  Swelling of the abdomen that is new, acute  Fever of 100F or higher   For urgent or emergent issues, a gastroenterologist can be reached at any hour by calling [phone].   DIET: Your first meal following the procedure should be a small meal and then it is ok  to progress to your normal diet. Heavy or fried foods are harder to digest and may make you feel nauseous or bloated.  Likewise, meals heavy in dairy and vegetables can increase bloating.  Drink plenty of fluids but you should avoid alcoholic beverages for 24 hours.  ACTIVITY:  You should plan to take it easy for the rest of today and you should NOT DRIVE or use heavy machinery until tomorrow (because of the sedation medicines used during the test).    FOLLOW UP: Our staff will call the number listed on your records the next business day following your procedure to check on you and address any questions or concerns that you may have regarding the information given to you following your procedure. If we do not reach you, we will leave a message.  However, if you are feeling well and you are not experiencing any problems, there is no need to return our call.  We will assume that you have returned to your regular daily activities without incident.  If any biopsies were taken you will be contacted by phone or by letter within the next 1-3 weeks.  Please call us at [phone] if you have not heard about the biopsies in 3 weeks.    SIGNATURES/CONFIDENTIALITY: You and/or your care partner have signed paperwork which will be entered into your electronic medical record.  These signatures attest to the fact that that the information above on your After Visit Summary has  been reviewed and is understood.  Full responsibility of the confidentiality of this discharge information lies with you and/or your care-partner.

## 2015-04-14 ENCOUNTER — Telehealth: Payer: Self-pay | Admitting: *Deleted

## 2015-04-14 NOTE — Telephone Encounter (Signed)
  Follow up Call-  Call back number 04/13/2015  Post procedure Call Back phone  # [phone]  Permission to leave phone message Yes     Patient questions:  Do you have a fever, pain , or abdominal swelling? No. Pain Score  0 *  Have you tolerated food without any problems? Yes.    Have you been able to return to your normal activities? Yes.    Do you have any questions about your discharge instructions: Diet   No. Medications  No. Follow up visit  No.  Do you have questions or concerns about your Care? No.  Actions: * If pain score is 4 or above: No action needed, pain <4.

## 2015-04-25 ENCOUNTER — Emergency Department
Admission: EM | Admit: 2015-04-25 | Discharge: 2015-04-25 | Disposition: A | Discharge status: Home / Self Care | Payer: Medicare Other | Attending: Emergency Medicine | Admitting: Emergency Medicine

## 2015-04-25 ENCOUNTER — Emergency Department: Payer: Medicare Other

## 2015-04-25 DIAGNOSIS — E119 Type 2 diabetes mellitus without complications: Secondary | ICD-10-CM | POA: Insufficient documentation

## 2015-04-25 DIAGNOSIS — R6889 Other general symptoms and signs: Secondary | ICD-10-CM | POA: Diagnosis not present

## 2015-04-25 DIAGNOSIS — Z880 Allergy status to penicillin: Secondary | ICD-10-CM | POA: Insufficient documentation

## 2015-04-25 DIAGNOSIS — R42 Dizziness and giddiness: Secondary | ICD-10-CM | POA: Diagnosis not present

## 2015-04-25 DIAGNOSIS — I10 Essential (primary) hypertension: Secondary | ICD-10-CM | POA: Insufficient documentation

## 2015-04-25 LAB — CBC WITH DIFFERENTIAL/PLATELET
BASOS PCT: 1 %
Basophils Absolute: 0.1 10*3/uL (ref 0–0.1)
EOS ABS: 0.1 10*3/uL (ref 0–0.7)
EOS PCT: 1 %
HCT: 38.9 % (ref 35.0–47.0)
HEMOGLOBIN: 13.1 g/dL (ref 12.0–16.0)
Lymphocytes Relative: 16 %
Lymphs Abs: 1.5 10*3/uL (ref 1.0–3.6)
MCH: 30.3 pg (ref 26.0–34.0)
MCHC: 33.7 g/dL (ref 32.0–36.0)
MCV: 89.7 fL (ref 80.0–100.0)
Monocytes Absolute: 0.8 10*3/uL (ref 0.2–0.9)
Monocytes Relative: 9 %
NEUTROS PCT: 73 %
Neutro Abs: 6.6 10*3/uL — ABNORMAL HIGH (ref 1.4–6.5)
PLATELETS: 220 10*3/uL (ref 150–440)
RBC: 4.33 MIL/uL (ref 3.80–5.20)
RDW: 13.1 % (ref 11.5–14.5)
WBC: 9.1 10*3/uL (ref 3.6–11.0)

## 2015-04-25 LAB — COMPREHENSIVE METABOLIC PANEL
ALBUMIN: 4.1 g/dL (ref 3.5–5.0)
ALK PHOS: 85 U/L (ref 38–126)
ALT: 19 U/L (ref 14–54)
AST: 21 U/L (ref 15–41)
Anion gap: 6 (ref 5–15)
BUN: 22 mg/dL — AB (ref 6–20)
CALCIUM: 9 mg/dL (ref 8.9–10.3)
CO2: 27 mmol/L (ref 22–32)
CREATININE: 0.58 mg/dL (ref 0.44–1.00)
Chloride: 101 mmol/L (ref 101–111)
GFR calc Af Amer: >60 mL/min (ref >60)
GLUCOSE: 126 mg/dL — AB (ref 65–99)
POTASSIUM: 3.7 mmol/L (ref 3.5–5.1)
Sodium: 134 mmol/L — ABNORMAL LOW (ref 135–145)
TOTAL PROTEIN: 6.9 g/dL (ref 6.5–8.1)

## 2015-04-25 LAB — URINALYSIS COMPLETE WITH MICROSCOPIC (ARMC ONLY)
BILIRUBIN URINE: NEGATIVE
Bacteria, UA: NONE SEEN
GLUCOSE, UA: NEGATIVE mg/dL
Hgb urine dipstick: NEGATIVE
KETONES UR: NEGATIVE mg/dL
Leukocytes, UA: NEGATIVE
NITRITE: NEGATIVE
PH: 6 (ref 5.0–8.0)
Protein, ur: NEGATIVE mg/dL
Specific Gravity, Urine: 1.014 (ref 1.005–1.030)
Squamous Epithelial / LPF: NONE SEEN

## 2015-04-25 MED ORDER — DIAZEPAM 5 MG/ML IJ SOLN
5.0000 mg | Freq: Once | INTRAMUSCULAR | Status: AC
  Administered 2015-04-25: 5 mg via INTRAVENOUS
  Filled 2015-04-25: qty 2

## 2015-04-25 MED ORDER — MECLIZINE HCL 25 MG PO TABS
50.0000 mg | ORAL_TABLET | Freq: Once | ORAL | Status: AC
  Administered 2015-04-25: 50 mg via ORAL
  Filled 2015-04-25: qty 2

## 2015-04-25 MED ORDER — MECLIZINE HCL 25 MG PO TABS: 25.0000 mg | ORAL_TABLET | Freq: Three times a day (TID) | ORAL | Status: DC | PRN

## 2015-04-25 MED ORDER — SODIUM CHLORIDE 0.9 % IV SOLN
1000.0000 mL | Freq: Once | INTRAVENOUS | Status: AC
  Administered 2015-04-25: 1000 mL via INTRAVENOUS

## 2015-04-25 MED ORDER — DIAZEPAM 5 MG PO TABS: 5.0000 mg | ORAL_TABLET | Freq: Three times a day (TID) | ORAL | Status: DC | PRN

## 2015-04-25 NOTE — ED Notes (Signed)
Pt to room 11 via EMS from [facility] where she reports that she developed sudden onset of dizziness, she reports that she felt like she was under water.   No nausea, no diaphoresis, denies chest pain and SOB.

## 2015-04-25 NOTE — Discharge Instructions (Signed)

## 2015-04-25 NOTE — ED Provider Notes (Signed)
[facility] Emergency Department Provider Note     Time seen: ----------------------------------------- 5:47 PM on 04/25/2015 -----------------------------------------    I have reviewed the triage vital signs and the nursing notes.   HISTORY  Chief Complaint No chief complaint on file.    HPI Kristen Rivas is a 80 y.o. female who presents to ER for swimmy headed feeling, feeling off balance and feeling of fullness in her head. She states she had got dizzy and lightheaded earlier, states she's not had any numbness tingling or weakness. Denies any history of this happening to her before. She was diagnosed with a CVA in the past when she had left-sided numbness and weakness. States she does not have these symptoms today.   Past Medical History  Diagnosis Date  . CARDIAC ARRHYTHMIA   . Palpitations   . Anxiety state, unspecified   . DIABETES MELLITUS, TYPE II   . HYPERTENSION   . DYSLIPIDEMIA   . CVA 07/2006    right thalamic CVA    Patient Active Problem List   Diagnosis Date Noted  . Osteopenia 11/15/2013  . Pre-diabetes 09/03/2010  . FATIGUE 04/09/2010  . Hyperlipidemia 09/08/2009  . ANXIETY STATE, UNSPECIFIED 08/10/2009  . Essential hypertension 08/04/2009  . CARDIAC ARRHYTHMIA 08/04/2009  . Cerebral artery occlusion with cerebral infarction ([place]) 08/04/2009  . PALPITATIONS 08/04/2009  . Urinary incontinence 08/04/2009  . Diverticulosis of colon without hemorrhage 08/04/2009    Past Surgical History  Procedure Laterality Date  . Abdominal hysterectomy  1974  . Wisdom tooth extraction    . Back surgery  1981    4-5 lumbar inner body fusion  . Hemorrhoid surgery      x's 2 1961 & 1964  . Breast enhancement surgery    . Colonoscopy      tics  . Nasal sinus surgery  1985    polyps removed    Allergies Penicillins and Terramycin  Social History Social History  Substance Use Topics  . Smoking status: Never Smoker   . Smokeless  tobacco: Never Used     Comment: Divorced, lives alone. Retired  . Alcohol Use: No    Review of Systems Constitutional: Negative for fever. Eyes: Negative for visual changes. ENT: Negative for sore throat. Cardiovascular: Negative for chest pain. Respiratory: Negative for shortness of breath. Gastrointestinal: Negative for abdominal pain, vomiting and diarrhea. Genitourinary: Negative for dysuria. Musculoskeletal: Negative for back pain. Skin: Negative for rash. Neurological: Negative for headaches, focal weakness or numbness. Positive for dizziness  10-point ROS otherwise negative.  ____________________________________________   PHYSICAL EXAM:  VITAL SIGNS: ED Triage Vitals  Enc Vitals Group     BP --      Pulse --      Resp --      Temp --      Temp src --      SpO2 --      Weight --      Height --      Head Cir --      Peak Flow --      Pain Score --      Pain Loc --      Pain Edu? --      Excl. in [place]? --     Constitutional: Alert and oriented. Well appearing and in no distress. Anxious Eyes: Conjunctivae are normal. PERRL. Normal extraocular movements. ENT   Head: Normocephalic and atraumatic.   Nose: No congestion/rhinnorhea.   Mouth/Throat: Mucous membranes are moist.  Neck: No stridor. Cardiovascular: Normal rate, regular rhythm. Normal and symmetric distal pulses are present in all extremities. No murmurs, rubs, or gallops. Respiratory: Normal respiratory effort without tachypnea nor retractions. Breath sounds are clear and equal bilaterally. No wheezes/rales/rhonchi. Gastrointestinal: Soft and nontender. No distention. No abdominal bruits.  Musculoskeletal: Nontender with normal range of motion in all extremities. No joint effusions.  No lower extremity tenderness nor edema. Neurologic:  Normal speech and language. No gross focal neurologic deficits are appreciated. Speech is normal. No gait instability. Skin:  Skin is warm, dry and intact.  No rash noted. Psychiatric: Mood and affect are normal. Speech and behavior are normal. Patient exhibits appropriate insight and judgment. ____________________________________________  EKG: Interpreted by me. Normal sinus rhythm with a rate of 86 bpm, normal PR interval, normal QRS with, normal QT interval. Nonspecific ST and T-wave changes  ____________________________________________  ED COURSE:  Pertinent labs & imaging results that were available during my care of the patient were reviewed by me and considered in my medical decision making (see chart for details). Patient is no acute distress, likely peripheral vertigo and anxiety. Patient will receive meclizine and Valium as well as IV fluid bolus. ____________________________________________    LABS (pertinent positives/negatives)  Labs Reviewed  CBC WITH DIFFERENTIAL/PLATELET - Abnormal; Notable for the following:    Neutro Abs 6.6 (*)    All other components within normal limits  URINALYSIS COMPLETEWITH MICROSCOPIC ([facility] ONLY) - Abnormal; Notable for the following:    Color, Urine YELLOW (*)    APPearance CLEAR (*)    All other components within normal limits  COMPREHENSIVE METABOLIC PANEL - Abnormal; Notable for the following:    Sodium 134 (*)    Glucose, Bld 126 (*)    BUN 22 (*)    Total Bilirubin <0.1 (*)    All other components within normal limits    RADIOLOGY Images were viewed by me  CT head IMPRESSION: No acute intracranial pathology. ____________________________________________  FINAL ASSESSMENT AND PLAN  Dizziness  Plan: Patient with labs and imaging as dictated above. Patient is feeling better, unclear etiology however likely vertigo. Patient has no symptoms currently, will be discharged meclizine and Valium. She is advised follow up with Dr. 2 days for recheck.   Earleen Newport, MD   Earleen Newport, MD 04/25/15 [phone]

## 2015-04-25 NOTE — ED Notes (Signed)
Call from lab that chemistries (CMP and troponin) will need to be reordered and redrawn because it is hemolyzed

## 2015-04-28 ENCOUNTER — Telehealth: Payer: Self-pay | Admitting: Internal Medicine

## 2015-04-28 NOTE — Telephone Encounter (Signed)
Patient was in the hospital and was advised to have a hospital follow up.  She would like a call back in regards.  She does not want to make an appointment until she speak to [employer] .

## 2015-04-28 NOTE — Telephone Encounter (Signed)
Spoke to pt and advised that it was needed. Appt made.

## 2015-04-28 NOTE — Telephone Encounter (Signed)
Please see previous note.  Accidentally closed

## 2015-05-01 ENCOUNTER — Ambulatory Visit (INDEPENDENT_AMBULATORY_CARE_PROVIDER_SITE_OTHER): Payer: Medicare Other | Admitting: Internal Medicine

## 2015-05-01 ENCOUNTER — Encounter: Payer: Self-pay | Admitting: Internal Medicine

## 2015-05-01 VITALS — BP 130/64 | HR 75 | Temp 97.9°F | Resp 16 | Wt 136.0 lb

## 2015-05-01 DIAGNOSIS — H8110 Benign paroxysmal vertigo, unspecified ear: Secondary | ICD-10-CM | POA: Diagnosis not present

## 2015-05-01 DIAGNOSIS — Z8673 Personal history of transient ischemic attack (TIA), and cerebral infarction without residual deficits: Secondary | ICD-10-CM | POA: Diagnosis not present

## 2015-05-01 DIAGNOSIS — I6350 Cerebral infarction due to unspecified occlusion or stenosis of unspecified cerebral artery: Secondary | ICD-10-CM

## 2015-05-01 NOTE — Progress Notes (Signed)
Subjective:    Patient ID: Kristen Rivas, female    DOB: August 11, 1934, 80 y.o.   MRN: [id number]  HPI She is here for hospital follow up.  She went to the ED for dizziness/lighteheadedness, fullness in her head and feeling off balanced.  She was shopping when the symptoms started suddenly.  The room was spinning.  She closed her eyes and did not have nausea.  She has a history of CVA that was associated with left sided numbness and weakness.  She denied any tingling, weakness in the ED.  Her neurological exam was normal in the ED.  Her EKG showed no acute changes.  CT of the head showed no acute findings.  Basic blood work and urinalysis were normal.  She was diagnosed with vertigo and anxiety.  She received IVF, meclizine and valium in the ED.  After discharge she took one meclizine as a preventive measure, but did not take any valium.  She just felt very mild pressure in her head and was concerned of vertigo was going to start again. She was very tired from the medication she received the medication in the ED and did not want to take any more sedating medication.  She has not had any dizziness since being discharged. She denies any headaches or lightheadedness. She also denies cold symptoms in the past couple of weeks. She is planning on traveling next week and wanted to make sure it was okay that she went.   Medications and allergies reviewed with patient and updated if appropriate.  Patient Active Problem List   Diagnosis Date Noted  . Osteopenia 11/15/2013  . Pre-diabetes 09/03/2010  . FATIGUE 04/09/2010  . Hyperlipidemia 09/08/2009  . ANXIETY STATE, UNSPECIFIED 08/10/2009  . Essential hypertension 08/04/2009  . CARDIAC ARRHYTHMIA 08/04/2009  . Cerebral artery occlusion with cerebral infarction ([place]) 08/04/2009  . PALPITATIONS 08/04/2009  . Urinary incontinence 08/04/2009  . Diverticulosis of colon without hemorrhage 08/04/2009    Past Medical History  Diagnosis Date  . CARDIAC  ARRHYTHMIA   . Palpitations   . Anxiety state, unspecified   . DIABETES MELLITUS, TYPE II   . HYPERTENSION   . DYSLIPIDEMIA   . CVA 07/2006    right thalamic CVA    Past Surgical History  Procedure Laterality Date  . Abdominal hysterectomy  1974  . Wisdom tooth extraction    . Back surgery  1981    4-5 lumbar inner body fusion  . Hemorrhoid surgery      x's 2 1961 & 1964  . Breast enhancement surgery    . Colonoscopy      tics  . Nasal sinus surgery  1985    polyps removed    Social History   Social History  . Marital Status: Divorced    Spouse Name: N/A  . Number of Children: N/A  . Years of Education: N/A   Social History Main Topics  . Smoking status: Never Smoker   . Smokeless tobacco: Never Used     Comment: Divorced, lives alone. Retired  . Alcohol Use: No  . Drug Use: No  . Sexual Activity: No   Other Topics Concern  . None   Social History Narrative    Review of Systems  Constitutional: Negative for fever and chills.  HENT: Negative for congestion, ear pain, hearing loss and sinus pressure.   Respiratory: Negative for shortness of breath.   Cardiovascular: Negative for chest pain and palpitations.  Gastrointestinal: Negative for nausea.  Neurological: Negative for speech difficulty, weakness, light-headedness, numbness and headaches.       Objective:   Filed Vitals:   05/01/15 0937  BP: 130/64  Pulse: 75  Temp: 97.9 F (36.6 C)  Resp: 16   Filed Weights   05/01/15 0937  Weight: 136 lb (61.689 kg)   Body mass index is 21.96 kg/(m^2).   Physical Exam  Constitutional: She is oriented to person, place, and time. She appears well-developed and well-nourished. No distress.  HENT:  Head: Normocephalic and atraumatic.  Right Ear: External ear normal.  Left Ear: External ear normal.  Mouth/Throat: Oropharynx is clear and moist.  Eyes: Conjunctivae and EOM are normal.  Neck: Neck supple. No tracheal deviation present. No thyromegaly  present.  No carotid bruit  Cardiovascular: Normal rate, regular rhythm and normal heart sounds.   Pulmonary/Chest: Effort normal and breath sounds normal. No respiratory distress. She has no wheezes.  Musculoskeletal: She exhibits no edema.  Lymphadenopathy:    She has no cervical adenopathy.  Neurological: She is alert and oriented to person, place, and time. No cranial nerve deficit.  Normal gait, normal sensation and strength all extremities  Psychiatric: She has a normal mood and affect. Her behavior is normal.          Assessment & Plan:   Vertigo We reviewed her testing done in the emergency room I agree that she likely had vertigo Discussed etiology and treatment We will go ahead and repeat an ultrasound of carotid arteries given her history of stroke-last ultrasound done 8 years ago She will keep the meclizine and Valium on hand-she will take half of a pill of one of the medications if she experiences similar vertigo. She is aware that both medications will make her drowsy  If her symptoms return she will call or return

## 2015-05-01 NOTE — Progress Notes (Signed)
Pre visit review using our clinic review tool, if applicable. No additional management support is needed unless otherwise documented below in the visit note. 

## 2015-05-01 NOTE — Patient Instructions (Addendum)
You can take 1/2 dose of the meclizine or valium if needed for dizziness.   We will order a carotid artery ultrasound to be done at some point in the near future.    Vertigo Vertigo means you feel like you or your surroundings are moving when they are not. Vertigo can be dangerous if it occurs when you are at work, driving, or performing difficult activities.  CAUSES  Vertigo occurs when there is a conflict of signals sent to your brain from the visual and sensory systems in your body. There are many different causes of vertigo, including:  Infections, especially in the inner ear.  A bad reaction to a drug or misuse of alcohol and medicines.  Withdrawal from drugs or alcohol.  Rapidly changing positions, such as lying down or rolling over in bed.  A migraine headache.  Decreased blood flow to the brain.  Increased pressure in the brain from a head injury, infection, tumor, or bleeding. SYMPTOMS  You may feel as though the world is spinning around or you are falling to the ground. Because your balance is upset, vertigo can cause nausea and vomiting. You may have involuntary eye movements (nystagmus). DIAGNOSIS  Vertigo is usually diagnosed by physical exam. If the cause of your vertigo is unknown, your caregiver may perform imaging tests, such as an MRI scan (magnetic resonance imaging). TREATMENT  Most cases of vertigo resolve on their own, without treatment. Depending on the cause, your caregiver may prescribe certain medicines. If your vertigo is related to body position issues, your caregiver may recommend movements or procedures to correct the problem. In rare cases, if your vertigo is caused by certain inner ear problems, you may need surgery. HOME CARE INSTRUCTIONS   Follow your caregiver's instructions.  Avoid driving.  Avoid operating heavy machinery.  Avoid performing any tasks that would be dangerous to you or others during a vertigo episode.  Tell your caregiver if  you notice that certain medicines seem to be causing your vertigo. Some of the medicines used to treat vertigo episodes can actually make them worse in some people. SEEK IMMEDIATE MEDICAL CARE IF:   Your medicines do not relieve your vertigo or are making it worse.  You develop problems with talking, walking, weakness, or using your arms, hands, or legs.  You develop severe headaches.  Your nausea or vomiting continues or gets worse.  You develop visual changes.  A family member notices behavioral changes.  Your condition gets worse. MAKE SURE YOU:  Understand these instructions.  Will watch your condition.  Will get help right away if you are not doing well or get worse.   This information is not intended to replace advice given to you by your health care provider. Make sure you discuss any questions you have with your health care provider.   Document Released: 04/06/2005 Document Revised: 09/19/2011 Document Reviewed: 10/20/2014 Elsevier Interactive Patient Education [employer].

## 2015-05-06 DIAGNOSIS — L820 Inflamed seborrheic keratosis: Secondary | ICD-10-CM | POA: Diagnosis not present

## 2015-05-06 DIAGNOSIS — H2513 Age-related nuclear cataract, bilateral: Secondary | ICD-10-CM | POA: Diagnosis not present

## 2015-05-06 DIAGNOSIS — D1800 Hemangioma unspecified site: Secondary | ICD-10-CM | POA: Diagnosis not present

## 2015-05-06 DIAGNOSIS — H1851 Endothelial corneal dystrophy: Secondary | ICD-10-CM | POA: Diagnosis not present

## 2015-05-06 DIAGNOSIS — L814 Other melanin hyperpigmentation: Secondary | ICD-10-CM | POA: Diagnosis not present

## 2015-07-11 DIAGNOSIS — H6503 Acute serous otitis media, bilateral: Secondary | ICD-10-CM | POA: Diagnosis not present

## 2015-09-01 ENCOUNTER — Other Ambulatory Visit: Payer: Self-pay | Admitting: Internal Medicine

## 2015-09-01 DIAGNOSIS — Z8673 Personal history of transient ischemic attack (TIA), and cerebral infarction without residual deficits: Secondary | ICD-10-CM

## 2015-09-01 DIAGNOSIS — R42 Dizziness and giddiness: Secondary | ICD-10-CM

## 2015-09-17 ENCOUNTER — Telehealth: Payer: Self-pay | Admitting: Internal Medicine

## 2015-09-21 MED ORDER — SIMVASTATIN 10 MG PO TABS: 5.0000 mg | ORAL_TABLET | Freq: Every day | ORAL | Status: DC

## 2015-09-21 NOTE — Addendum Note (Signed)
Addended by: Terence Lux B on: 09/21/2015 01:47 PM   Modules accepted: Orders

## 2015-09-21 NOTE — Telephone Encounter (Signed)
Pt called and need said that she is transferring to burns and needs refill

## 2015-09-28 ENCOUNTER — Ambulatory Visit (HOSPITAL_COMMUNITY)
Admission: RE | Admit: 2015-09-28 | Discharge: 2015-09-28 | Disposition: A | Discharge status: Home / Self Care | Payer: Medicare Other | Source: Ambulatory Visit | Attending: Internal Medicine | Admitting: Internal Medicine

## 2015-09-28 DIAGNOSIS — I6523 Occlusion and stenosis of bilateral carotid arteries: Secondary | ICD-10-CM | POA: Diagnosis not present

## 2015-09-28 DIAGNOSIS — I10 Essential (primary) hypertension: Secondary | ICD-10-CM | POA: Insufficient documentation

## 2015-09-28 DIAGNOSIS — E785 Hyperlipidemia, unspecified: Secondary | ICD-10-CM | POA: Insufficient documentation

## 2015-09-28 DIAGNOSIS — E119 Type 2 diabetes mellitus without complications: Secondary | ICD-10-CM | POA: Insufficient documentation

## 2015-09-28 DIAGNOSIS — R42 Dizziness and giddiness: Secondary | ICD-10-CM | POA: Diagnosis not present

## 2015-09-28 DIAGNOSIS — Z8673 Personal history of transient ischemic attack (TIA), and cerebral infarction without residual deficits: Secondary | ICD-10-CM | POA: Insufficient documentation

## 2015-09-29 ENCOUNTER — Encounter: Payer: Self-pay | Admitting: Internal Medicine

## 2015-09-29 DIAGNOSIS — I779 Disorder of arteries and arterioles, unspecified: Secondary | ICD-10-CM | POA: Insufficient documentation

## 2015-09-29 DIAGNOSIS — I739 Peripheral vascular disease, unspecified: Secondary | ICD-10-CM

## 2015-10-15 ENCOUNTER — Ambulatory Visit (INDEPENDENT_AMBULATORY_CARE_PROVIDER_SITE_OTHER): Payer: Medicare Other | Admitting: Internal Medicine

## 2015-10-15 ENCOUNTER — Encounter: Payer: Self-pay | Admitting: Internal Medicine

## 2015-10-15 VITALS — BP 130/68 | HR 86 | Temp 98.0°F | Resp 16 | Ht 66.0 in | Wt 134.0 lb

## 2015-10-15 DIAGNOSIS — R270 Ataxia, unspecified: Secondary | ICD-10-CM | POA: Diagnosis not present

## 2015-10-15 DIAGNOSIS — H9201 Otalgia, right ear: Secondary | ICD-10-CM | POA: Diagnosis not present

## 2015-10-15 MED ORDER — HYDROCODONE-ACETAMINOPHEN 5-325 MG PO TABS: 1.0000 | ORAL_TABLET | Freq: Four times a day (QID) | ORAL | Status: DC | PRN

## 2015-10-15 NOTE — Progress Notes (Signed)
Pre visit review using our clinic review tool, if applicable. No additional management support is needed unless otherwise documented below in the visit note. 

## 2015-10-15 NOTE — Patient Instructions (Signed)
Earache An earache, also called otalgia, can be caused by many things. Pain from an earache can be sharp, dull, or burning. The pain may be temporary or constant. Earaches can be caused by problems with the ear, such as infection in either the middle ear or the ear canal, injury, impacted ear wax, middle ear pressure, or a foreign body in the ear. Ear pain can also result from problems in other areas. This is called referred pain. For example, pain can come from a sore throat, a tooth infection, or problems with the jaw or the joint between the jaw and the skull (temporomandibular joint, or TMJ). The cause of an earache is not always easy to identify. Watchful waiting may be appropriate for some earaches until a clear cause of the pain can be found. HOME CARE INSTRUCTIONS Watch your condition for any changes. The following actions may help to lessen any discomfort that you are feeling:  Take medicines only as directed by your health care provider. This includes ear drops.  Apply ice to your outer ear to help reduce pain.  Put ice in a plastic bag.  Place a towel between your skin and the bag.  Leave the ice on for 20 minutes, 2-3 times per day.  Do not put anything in your ear other than medicine that is prescribed by your health care provider.  Try resting in an upright position instead of lying down. This may help to reduce pressure in the middle ear and relieve pain.  Chew gum if it helps to relieve your ear pain.  Control any allergies that you have.  Keep all follow-up visits as directed by your health care provider. This is important. SEEK MEDICAL CARE IF:  Your pain does not improve within 2 days.  You have a fever.  You have new or worsening symptoms. SEEK IMMEDIATE MEDICAL CARE IF:  You have a severe headache.  You have a stiff neck.  You have difficulty swallowing.  You have redness or swelling behind your ear.  You have drainage from your ear.  You have hearing  loss.  You feel dizzy.   This information is not intended to replace advice given to you by your health care provider. Make sure you discuss any questions you have with your health care provider.   Document Released: 02/12/2004 Document Revised: 07/18/2014 Document Reviewed: 01/26/2014 Elsevier Interactive Patient Education 2016 [employer] Inc[employer]  

## 2015-10-15 NOTE — Progress Notes (Signed)
Subjective:  Patient ID: Kristen Rivas, female    DOB: May 27, 1935  Age: 81 y.o. MRN: [id number]  CC: Ear Pain   HPI Kristen Rivas presents for a 4 month history of right-sided ear pain and ataxia. She was in [place] when she developed a sharp pain in her right ear. She was seen at an urgent care center and was told that the ear examination was normal. She was given a prescription for gabapentin. She tells me gabapentin did control the pain and she did not develop any side effects. She read the label on gabapentin and became concerned about side effects so she has not taken it again. The ear pain radiates to the right parietal region and sometimes keeps her awake at night. She was to try something other in gabapentin for pain relief. She also has developed a mild sensation of feeling off balance and like she may fall to the left. She had a very small CVA about 7 or 8 years ago. She denies loss of hearing or tinnitus. She wants to see an ENT doctor.  History Kristen Rivas has a past medical history of CARDIAC ARRHYTHMIA; Palpitations; Anxiety state, unspecified; DIABETES MELLITUS, TYPE II; HYPERTENSION; DYSLIPIDEMIA; and CVA (07/2006).   She has past surgical history that includes Abdominal hysterectomy (1974); Wisdom tooth extraction; Back surgery (1981); Hemorrhoid surgery; Breast enhancement surgery; Colonoscopy; and Nasal sinus surgery (1985).   Her family history includes Aneurysm in her brother; Arthritis in her mother and other; Colon cancer (age of onset: 36) in her sister; Diabetes in her other; Heart disease in her brother; Lung cancer in her brother and sister; Stroke in her other.She reports that she has never smoked. She has never used smokeless tobacco. She reports that she does not drink alcohol or use illicit drugs.  Outpatient Prescriptions Prior to Visit  Medication Sig Dispense Refill  . amLODipine-olmesartan (AZOR) 5-40 MG per tablet Take 1 tablet by mouth every morning. 90 tablet 3  .  ascorbic acid (VITAMIN C) 500 MG tablet Take 500 mg by mouth 2 (two) times daily. Not everyday    . aspirin 81 MG tablet Take 162 mg by mouth daily.    . Calcium Carb-Cholecalciferol (CALCIUM 600+D) 600-800 MG-UNIT TABS Take 1 tablet by mouth 2 (two) times daily. 60 tablet   . cholecalciferol (VITAMIN D) 1000 UNITS tablet Take 1,000 Units by mouth daily.    . diazepam (VALIUM) 5 MG tablet Take 1 tablet (5 mg total) by mouth every 8 (eight) hours as needed for muscle spasms. 20 tablet 0  . glucose blood (ACCU-CHEK AVIVA PLUS) test strip Use to check blood sugar once a day. Dx 250.00 100 each 3  . meclizine (ANTIVERT) 25 MG tablet Take 1 tablet (25 mg total) by mouth 3 (three) times daily as needed for dizziness or nausea. 30 tablet 1  . simvastatin (ZOCOR) 10 MG tablet Take 0.5 tablets (5 mg total) by mouth at bedtime. 45 tablet 0   No facility-administered medications prior to visit.    ROS Review of Systems  Constitutional: Negative.  Negative for fever, chills, diaphoresis, appetite change and fatigue.  HENT: Positive for ear pain. Negative for sinus pressure, sore throat, trouble swallowing and voice change.   Eyes: Negative.  Negative for photophobia and visual disturbance.  Respiratory: Negative.  Negative for cough, choking, chest tightness, shortness of breath and stridor.   Cardiovascular: Negative.  Negative for chest pain, palpitations and leg swelling.  Gastrointestinal: Negative.  Negative for nausea,  vomiting, abdominal pain, diarrhea, constipation and blood in stool.  Endocrine: Negative.   Genitourinary: Negative.  Negative for difficulty urinating.  Musculoskeletal: Positive for gait problem. Negative for myalgias, back pain, arthralgias and neck pain.  Skin: Negative.   Allergic/Immunologic: Negative.   Neurological: Negative.  Negative for dizziness, tremors, seizures, syncope, facial asymmetry, speech difficulty, weakness, light-headedness, numbness and headaches.    Hematological: Negative.  Negative for adenopathy. Does not bruise/bleed easily.  Psychiatric/Behavioral: Negative.     Objective:  BP 130/68 mmHg  Pulse 86  Temp(Src) 98 F (36.7 C) (Oral)  Resp 16  Ht 5\' 6"  (1.676 m)  Wt 134 lb (60.782 kg)  BMI 21.64 kg/m2  SpO2 97%  Physical Exam  Constitutional: No distress.  HENT:  Head: Normocephalic and atraumatic.  Right Ear: Hearing, tympanic membrane, external ear and ear canal normal.  Left Ear: Hearing, tympanic membrane, external ear and ear canal normal.  Mouth/Throat: Oropharynx is clear and moist and mucous membranes are normal. Mucous membranes are not pale, not dry and not cyanotic. No oral lesions. No trismus in the jaw. No uvula swelling. No oropharyngeal exudate, posterior oropharyngeal edema, posterior oropharyngeal erythema or tonsillar abscesses.  Eyes: Conjunctivae and EOM are normal. Pupils are equal, round, and reactive to light. Right eye exhibits no discharge. Left eye exhibits no discharge. No scleral icterus.  Neck: Normal range of motion. Neck supple. No JVD present. No tracheal deviation present. No thyromegaly present.  Cardiovascular: Normal rate, regular rhythm, normal heart sounds and intact distal pulses.  Exam reveals no gallop and no friction rub.   No murmur heard. Pulmonary/Chest: Effort normal and breath sounds normal. No stridor. No respiratory distress. She has no wheezes. She has no rales. She exhibits no tenderness.  Abdominal: Soft. Bowel sounds are normal. She exhibits no distension and no mass. There is no tenderness. There is no rebound and no guarding.  Musculoskeletal: Normal range of motion. She exhibits no edema or tenderness.  Lymphadenopathy:    She has no cervical adenopathy.  Neurological: She is alert. She has normal strength. She displays no atrophy, no tremor and normal reflexes. No cranial nerve deficit or sensory deficit. She exhibits normal muscle tone. She displays no seizure activity.  Coordination and gait abnormal. She displays Babinski's sign on the right side. She displays Babinski's sign on the left side.  Reflex Scores:      Tricep reflexes are 1+ on the right side and 1+ on the left side.      Bicep reflexes are 1+ on the right side and 1+ on the left side.      Brachioradialis reflexes are 1+ on the right side and 1+ on the left side.      Patellar reflexes are 1+ on the right side and 1+ on the left side.      Achilles reflexes are 1+ on the right side and 1+ on the left side. Skin: Skin is warm and dry. No rash noted. She is not diaphoretic. No erythema. No pallor.  Psychiatric: She has a normal mood and affect. Her behavior is normal. Judgment and thought content normal.  Vitals reviewed.   Lab Results  Component Value Date   WBC 9.1 04/25/2015   HGB 13.1 04/25/2015   HCT 38.9 04/25/2015   PLT 220 04/25/2015   GLUCOSE 126* 04/25/2015   CHOL 154 09/17/2014   TRIG 138.0 09/17/2014   HDL 40.70 09/17/2014   LDLCALC 86 09/17/2014   ALT 19 04/25/2015   AST  21 04/25/2015   NA 134* 04/25/2015   K 3.7 04/25/2015   CL 101 04/25/2015   CREATININE 0.58 04/25/2015   BUN 22* 04/25/2015   CO2 27 04/25/2015   TSH 1.02 11/15/2013   HGBA1C 6.1 03/23/2015   MICROALBUR <0.7 03/23/2015    Assessment & Plan:   Kristen Rivas was seen today for ear pain.  Diagnoses and all orders for this visit:  Ataxia- she complains of ataxia and has a mildly abnormal neurologic exam, I have ordered an MRI of her brain to see if there is been a progression of her prior CVA, a new CVA, tumor or lesion, demyelination, or a bleeding complication. -     MR Brain Wo Contrast; Future  Earache on right- will refer to ENT at her request, will get an MRI to see if she has a tumor and will also offer Norco for pain relief. -     HYDROcodone-acetaminophen (NORCO/VICODIN) 5-325 MG tablet; Take 1 tablet by mouth every 6 (six) hours as needed for moderate pain. -     MR Brain Wo Contrast; Future -      Ambulatory referral to ENT  I am having Kristen Rivas start on HYDROcodone-acetaminophen. I am also having her maintain her ascorbic acid, glucose blood, amLODipine-olmesartan, aspirin, Calcium Carb-Cholecalciferol, meclizine, diazepam, cholecalciferol, and simvastatin.  Meds ordered this encounter  Medications  . HYDROcodone-acetaminophen (NORCO/VICODIN) 5-325 MG tablet    Sig: Take 1 tablet by mouth every 6 (six) hours as needed for moderate pain.    Dispense:  45 tablet    Refill:  0     Follow-up: No Follow-up on file.  Kristen Calico, MD

## 2015-11-03 ENCOUNTER — Encounter: Payer: Self-pay | Admitting: Internal Medicine

## 2015-11-03 ENCOUNTER — Ambulatory Visit
Admission: RE | Admit: 2015-11-03 | Discharge: 2015-11-03 | Disposition: A | Discharge status: Home / Self Care | Payer: Medicare Other | Source: Ambulatory Visit | Attending: Internal Medicine | Admitting: Internal Medicine

## 2015-11-03 DIAGNOSIS — R42 Dizziness and giddiness: Secondary | ICD-10-CM | POA: Diagnosis not present

## 2015-11-03 DIAGNOSIS — H9201 Otalgia, right ear: Secondary | ICD-10-CM | POA: Diagnosis not present

## 2015-11-03 DIAGNOSIS — H903 Sensorineural hearing loss, bilateral: Secondary | ICD-10-CM | POA: Diagnosis not present

## 2015-11-03 DIAGNOSIS — R270 Ataxia, unspecified: Secondary | ICD-10-CM

## 2015-11-04 ENCOUNTER — Other Ambulatory Visit: Payer: Self-pay | Admitting: Internal Medicine

## 2015-11-04 ENCOUNTER — Encounter: Payer: Medicare Other | Admitting: Internal Medicine

## 2015-11-04 DIAGNOSIS — D2239 Melanocytic nevi of other parts of face: Secondary | ICD-10-CM | POA: Diagnosis not present

## 2015-11-04 DIAGNOSIS — R270 Ataxia, unspecified: Secondary | ICD-10-CM

## 2015-11-04 DIAGNOSIS — L820 Inflamed seborrheic keratosis: Secondary | ICD-10-CM | POA: Diagnosis not present

## 2015-11-04 DIAGNOSIS — D1800 Hemangioma unspecified site: Secondary | ICD-10-CM | POA: Diagnosis not present

## 2015-11-04 DIAGNOSIS — L570 Actinic keratosis: Secondary | ICD-10-CM | POA: Diagnosis not present

## 2015-11-04 DIAGNOSIS — L821 Other seborrheic keratosis: Secondary | ICD-10-CM | POA: Diagnosis not present

## 2015-11-04 DIAGNOSIS — H7093 Unspecified mastoiditis, bilateral: Secondary | ICD-10-CM

## 2015-11-04 DIAGNOSIS — D225 Melanocytic nevi of trunk: Secondary | ICD-10-CM | POA: Diagnosis not present

## 2015-11-04 DIAGNOSIS — L814 Other melanin hyperpigmentation: Secondary | ICD-10-CM | POA: Diagnosis not present

## 2015-11-04 DIAGNOSIS — Z85828 Personal history of other malignant neoplasm of skin: Secondary | ICD-10-CM | POA: Diagnosis not present

## 2015-11-04 DIAGNOSIS — H9201 Otalgia, right ear: Secondary | ICD-10-CM

## 2015-11-04 DIAGNOSIS — H7090 Unspecified mastoiditis, unspecified ear: Secondary | ICD-10-CM | POA: Insufficient documentation

## 2015-11-06 ENCOUNTER — Telehealth: Payer: Self-pay | Admitting: Internal Medicine

## 2015-11-06 NOTE — Telephone Encounter (Signed)
Pt informed of MRI results.

## 2015-11-06 NOTE — Telephone Encounter (Signed)
Pt called back.  Janace Hoard did you call her?  If you called her use home number

## 2015-11-10 ENCOUNTER — Telehealth: Payer: Self-pay | Admitting: Internal Medicine

## 2015-11-10 NOTE — Telephone Encounter (Signed)
Kristen Rivas ENT [phone]  Review mri and no reason to return to ENT  And everything was normal.  And pt is not having any pain any longer.

## 2015-11-11 NOTE — Telephone Encounter (Signed)
OK 

## 2015-11-11 NOTE — Telephone Encounter (Signed)
Please advise 

## 2015-11-19 ENCOUNTER — Other Ambulatory Visit (INDEPENDENT_AMBULATORY_CARE_PROVIDER_SITE_OTHER): Payer: Medicare Other

## 2015-11-19 ENCOUNTER — Encounter: Payer: Self-pay | Admitting: Internal Medicine

## 2015-11-19 ENCOUNTER — Ambulatory Visit (INDEPENDENT_AMBULATORY_CARE_PROVIDER_SITE_OTHER): Payer: Medicare Other | Admitting: Internal Medicine

## 2015-11-19 VITALS — BP 144/80 | HR 71 | Temp 98.2°F | Resp 16 | Wt 136.0 lb

## 2015-11-19 DIAGNOSIS — Z0000 Encounter for general adult medical examination without abnormal findings: Secondary | ICD-10-CM

## 2015-11-19 DIAGNOSIS — I10 Essential (primary) hypertension: Secondary | ICD-10-CM

## 2015-11-19 DIAGNOSIS — I739 Peripheral vascular disease, unspecified: Secondary | ICD-10-CM

## 2015-11-19 DIAGNOSIS — R7303 Prediabetes: Secondary | ICD-10-CM

## 2015-11-19 DIAGNOSIS — M8580 Other specified disorders of bone density and structure, unspecified site: Secondary | ICD-10-CM

## 2015-11-19 DIAGNOSIS — I779 Disorder of arteries and arterioles, unspecified: Secondary | ICD-10-CM

## 2015-11-19 DIAGNOSIS — E785 Hyperlipidemia, unspecified: Secondary | ICD-10-CM

## 2015-11-19 DIAGNOSIS — I6350 Cerebral infarction due to unspecified occlusion or stenosis of unspecified cerebral artery: Secondary | ICD-10-CM | POA: Diagnosis not present

## 2015-11-19 LAB — CBC WITH DIFFERENTIAL/PLATELET
BASOS ABS: 0 10*3/uL (ref 0.0–0.1)
Basophils Relative: 0.4 % (ref 0.0–3.0)
Eosinophils Absolute: 0.1 10*3/uL (ref 0.0–0.7)
Eosinophils Relative: 1.8 % (ref 0.0–5.0)
HCT: 38.9 % (ref 36.0–46.0)
HEMOGLOBIN: 13.2 g/dL (ref 12.0–15.0)
LYMPHS ABS: 2.3 10*3/uL (ref 0.7–4.0)
Lymphocytes Relative: 33.4 % (ref 12.0–46.0)
MCHC: 34 g/dL (ref 30.0–36.0)
MCV: 89 fl (ref 78.0–100.0)
MONO ABS: 0.6 10*3/uL (ref 0.1–1.0)
MONOS PCT: 8.6 % (ref 3.0–12.0)
NEUTROS PCT: 55.8 % (ref 43.0–77.0)
Neutro Abs: 3.9 10*3/uL (ref 1.4–7.7)
Platelets: 248 10*3/uL (ref 150.0–400.0)
RBC: 4.37 Mil/uL (ref 3.87–5.11)
RDW: 13.1 % (ref 11.5–15.5)
WBC: 6.9 10*3/uL (ref 4.0–10.5)

## 2015-11-19 LAB — COMPREHENSIVE METABOLIC PANEL
ALK PHOS: 79 U/L (ref 39–117)
ALT: 18 U/L (ref 0–35)
AST: 16 U/L (ref 0–37)
Albumin: 4.2 g/dL (ref 3.5–5.2)
BILIRUBIN TOTAL: 0.5 mg/dL (ref 0.2–1.2)
BUN: 18 mg/dL (ref 6–23)
CALCIUM: 9.6 mg/dL (ref 8.4–10.5)
CO2: 31 mEq/L (ref 19–32)
Chloride: 103 mEq/L (ref 96–112)
Creatinine, Ser: 0.58 mg/dL (ref 0.40–1.20)
GFR: 106.74 mL/min (ref >60.00)
Glucose, Bld: 108 mg/dL — ABNORMAL HIGH (ref 70–99)
Potassium: 4.3 mEq/L (ref 3.5–5.1)
Sodium: 141 mEq/L (ref 135–145)
TOTAL PROTEIN: 6.9 g/dL (ref 6.0–8.3)

## 2015-11-19 LAB — LIPID PANEL
Cholesterol: 146 mg/dL (ref 0–200)
HDL: 35.6 mg/dL — AB (ref >39.00)
LDL Cholesterol: 86 mg/dL (ref 0–99)
NonHDL: 110
TRIGLYCERIDES: 119 mg/dL (ref 0.0–149.0)
Total CHOL/HDL Ratio: 4
VLDL: 23.8 mg/dL (ref 0.0–40.0)

## 2015-11-19 LAB — TSH: TSH: 3.15 u[IU]/mL (ref 0.35–4.50)

## 2015-11-19 LAB — HEMOGLOBIN A1C: HEMOGLOBIN A1C: 6.4 % (ref 4.6–6.5)

## 2015-11-19 MED ORDER — AMLODIPINE-OLMESARTAN 5-40 MG PO TABS: 1.0000 | ORAL_TABLET | Freq: Every morning | ORAL | Status: DC

## 2015-11-19 MED ORDER — SIMVASTATIN 10 MG PO TABS: 10.0000 mg | ORAL_TABLET | Freq: Every day | ORAL | Status: DC

## 2015-11-19 MED ORDER — GLUCOSE BLOOD VI STRP: ORAL_STRIP | Status: DC

## 2015-11-19 NOTE — Assessment & Plan Note (Signed)
dexa up to date Exercising Taking calcium and vit d

## 2015-11-19 NOTE — Assessment & Plan Note (Signed)
Check a1c Exercise, healthy diet

## 2015-11-19 NOTE — Assessment & Plan Note (Signed)
Well controlled at home -- elevated here today She will continue to monitor closely Continue current medication dose Check labs

## 2015-11-19 NOTE — Progress Notes (Signed)
Pre visit review using our clinic review tool, if applicable. No additional management support is needed unless otherwise documented below in the visit note. 

## 2015-11-19 NOTE — Assessment & Plan Note (Signed)
Lipid controlled, but will increase to 10 mg daily for preventive reasons given stroke and increased chronic small vessel disease on MRI of head Recheck lipid, cmp in 2 months after increase

## 2015-11-19 NOTE — Assessment & Plan Note (Signed)
Taking asa 162 mg daily Taking low dose simvastatin - 5 mg daily --- will increase to 10 mg daily for preventive reasons Recheck lipid, cmp in 2 months

## 2015-11-19 NOTE — Patient Instructions (Addendum)
  [name] , Thank you for taking time to come for your Medicare Wellness Visit. I appreciate your ongoing commitment to your health goals. Please review the following plan we discussed and let me know if I can assist you in the future.   These are the goals we discussed: Goals    Try increasing your exercise      This is a list of the screening recommended for you and due dates:  Health Maintenance  Topic Date Due  . Flu Shot  02/09/2016  . Colon Cancer Screening  04/12/2020  . Tetanus Vaccine  03/17/2021  . DEXA scan (bone density measurement)  Completed  . Shingles Vaccine  Completed  . Pneumonia vaccines  Completed    Test(s) ordered today. Your results will be released to [place] (or called to you) after review, usually within 72hours after test completion. If any changes need to be made, you will be notified at that same time.  All other Health Maintenance issues reviewed.   All recommended immunizations and age-appropriate screenings are up-to-date or discussed.  No immunizations administered today.   Medications reviewed and updated.  Changes include increasing the the simvastatin to 10 mg daily.  Your prescription(s) have been submitted to your pharmacy. Please take as directed and contact our office if you believe you are having problem(s) with the medication(s).   Please followup in one year, sooner if needed

## 2015-11-19 NOTE — Progress Notes (Signed)
Subjective:    Patient ID: Kristen Rivas, female    DOB: 02/20/1935, 81 y.o.   MRN: [id number]  HPI Here for medicare wellness exam.   I have personally reviewed and have noted 1.The patient's medical and social history 2.Their use of alcohol, tobacco or illicit drugs 3.Their current medications and supplements 4.The patient's functional ability including ADL's, fall risks, home safety risks and hearing or visual impairment. 5.Diet and physical activities 6.Evidence for depression or mood disorders 7.Care team reviewed and updated in snapshot   Are there smokers in your home (other than you)? No  Risk Factors Exercise: walking 3 times a week - 30 minutes Dietary issues discussed: eats healthy, well balanced  Cardiac risk factors: advanced age, hypertension, hyperlipidemia  Depression Screen  Have you felt down, depressed or hopeless? No  Have you felt little interest or pleasure in doing things?  No  Activities of Daily Living In your present state of health, do you have any difficulty performing the following activities?:  Driving? No Managing money?  No Feeding yourself? No Getting from bed to chair? No Climbing a flight of stairs? No Preparing food and eating?: No Bathing or showering? No Getting dressed: No Getting to/using the toilet? No Moving around from place to place: No In the past year have you fallen or had a near fall?: No   Are you sexually active?  No  Do you have more than one partner?  N/A  Hearing Difficulties: No, does have some mild hearing loss - tested by ENT Do you often ask people to speak up or repeat themselves? No Do you experience ringing or noises in your ears? No Do you have difficulty understanding soft or whispered voices? No Vision:              Any change in vision:no              Up to date with eye exam:  yes Memory:  Do you feel that you have a problem with  memory? No  Do you often misplace items? No  Do you feel safe at home?  Yes  Cognitive Testing  Alert, Orientated? Yes  Normal Appearance? Yes  Recall of three objects?  Yes  Can perform simple calculations? Yes  Displays appropriate judgment? Yes  Can read the correct time from a watch face? Yes   Advanced Directives have been discussed with the patient? Yes  Medications and allergies reviewed with patient and updated if appropriate.  Patient Active Problem List   Diagnosis Date Noted  . Mastoiditis 11/04/2015  . Ataxia 10/15/2015  . Earache on right 10/15/2015  . Carotid artery disease ([place]) 09/29/2015  . Osteopenia 11/15/2013  . Pre-diabetes 09/03/2010  . FATIGUE 04/09/2010  . Hyperlipidemia 09/08/2009  . ANXIETY STATE, UNSPECIFIED 08/10/2009  . Essential hypertension 08/04/2009  . CARDIAC ARRHYTHMIA 08/04/2009  . Cerebral artery occlusion with cerebral infarction ([place]) 08/04/2009  . PALPITATIONS 08/04/2009  . Urinary incontinence 08/04/2009  . Diverticulosis of colon without hemorrhage 08/04/2009    Current Outpatient Prescriptions on File Prior to Visit  Medication Sig Dispense Refill  . amLODipine-olmesartan (AZOR) 5-40 MG per tablet Take 1 tablet by mouth every morning. 90 tablet 3  . ascorbic acid (VITAMIN C) 500 MG tablet Take 500 mg by mouth 2 (two) times daily. Not everyday    . aspirin 81 MG tablet Take 162 mg by mouth daily.    . Calcium Carb-Cholecalciferol (CALCIUM 600+D) 600-800 MG-UNIT TABS Take 1  tablet by mouth 2 (two) times daily. 60 tablet   . cholecalciferol (VITAMIN D) 1000 UNITS tablet Take 1,000 Units by mouth daily.    [name] glucose blood (ACCU-CHEK AVIVA PLUS) test strip Use to check blood sugar once a day. Dx 250.00 100 each 3  . simvastatin (ZOCOR) 10 MG tablet Take 0.5 tablets (5 mg total) by mouth at bedtime. 45 tablet 0  . [DISCONTINUED] Glucose Blood (ACCU-CHEK AVIVA VI) 1 each as directed.       No current facility-administered medications on  file prior to visit.    Past Medical History  Diagnosis Date  . CARDIAC ARRHYTHMIA   . Palpitations   . Anxiety state, unspecified   . DIABETES MELLITUS, TYPE II   . HYPERTENSION   . DYSLIPIDEMIA   . CVA 07/2006    right thalamic CVA    Past Surgical History  Procedure Laterality Date  . Abdominal hysterectomy  1974  . Wisdom tooth extraction    . Back surgery  1981    4-5 lumbar inner body fusion  . Hemorrhoid surgery      x's 2 1961 & 1964  . Breast enhancement surgery    . Colonoscopy      tics  . Nasal sinus surgery  1985    polyps removed    Social History   Social History  . Marital Status: Divorced    Spouse Name: N/A  . Number of Children: N/A  . Years of Education: N/A   Social History Main Topics  . Smoking status: Never Smoker   . Smokeless tobacco: Never Used     Comment: Divorced, lives alone. Retired  . Alcohol Use: No  . Drug Use: No  . Sexual Activity: No   Other Topics Concern  . Not on file   Social History Narrative    Family History  Problem Relation Age of Onset  . Arthritis Mother   . Lung cancer Sister     non smoker  . Colon cancer Sister 59    died at 71  . Heart disease Brother   . Lung cancer Brother     X3, all smokers  . Diabetes Other     diabetes  . Arthritis Other   . Stroke Other     parent &  M grandfather  . Aneurysm Brother     Review of Systems  Constitutional: Negative for fever, chills, appetite change and fatigue.  HENT: Positive for hearing loss (tested by ENT) and postnasal drip.   Eyes: Positive for visual disturbance (infrequent viisual disturbance - ).  Respiratory: Positive for cough (from PND). Negative for shortness of breath and wheezing.   Cardiovascular: Negative for chest pain and leg swelling.  Gastrointestinal: Negative for abdominal pain, diarrhea, constipation and blood in stool.       No gerd  Genitourinary: Negative for dysuria and hematuria.  Musculoskeletal: Positive for back pain  (lower back pain - arthritis where she had her surgery, T12 fracture).  Neurological: Positive for dizziness (rare). Negative for light-headedness and headaches.  Psychiatric/Behavioral: Negative for dysphoric mood. The patient is not nervous/anxious.        Objective:   Filed Vitals:   11/19/15 0838  BP: 144/80  Pulse: 71  Temp: 98.2 F (36.8 C)  Resp: 16   Filed Weights   11/19/15 0838  Weight: 136 lb (61.689 kg)   Body mass index is 21.96 kg/(m^2).   Physical Exam Constitutional: She appears well-developed and well-nourished. No distress.  HENT:  Head: Normocephalic and atraumatic.  Right Ear: External ear normal. Normal ear canal and TM Left Ear: External ear normal.  Normal ear canal and TM Mouth/Throat: Oropharynx is clear and moist.  Eyes: Conjunctivae and EOM are normal.  Neck: Neck supple. No tracheal deviation present. No thyromegaly present.  No carotid bruit  Cardiovascular: Normal rate, regular rhythm and normal heart sounds.   No murmur heard.  No edema. Pulmonary/Chest: Effort normal and breath sounds normal. No respiratory distress. She has no wheezes. She has no rales.  Breast: deferred to Gyn Abdominal: Soft. She exhibits no distension. There is no tenderness.  Lymphadenopathy: She has no cervical adenopathy.  Skin: Skin is warm and dry. She is not diaphoretic.  Psychiatric: She has a normal mood and affect. Her behavior is normal.         Assessment & Plan:   Wellness Exam Immunizations  Up to date Colonoscopy  Up to date  Mammogram  Will schedule dexa  Up to date  25 - sees Dr Marvel Plan Eye exam up to date Hearing loss - mild indentified on ear exam by ENT Memory concerns/difficulties - none Independent of ADLs - fully   A copy of her 5-10 year screening schedule has been given to her  She had an abnormal ABI with [facility] screening - mild abnormality on left -- will order an official ABI     See Problem List for Assessment and  Plan of chronic medical problems.

## 2015-11-20 ENCOUNTER — Telehealth: Payer: Self-pay | Admitting: *Deleted

## 2015-11-20 MED ORDER — GLUCOSE BLOOD VI STRP: ORAL_STRIP | Status: AC

## 2015-11-20 NOTE — Telephone Encounter (Signed)
Pharmacy states the script for the testing strips need to be resubmitted w/the ICD-10 code per medicare guide line. Resent strips...Kristen Rivas

## 2015-11-23 ENCOUNTER — Encounter: Payer: Self-pay | Admitting: Emergency Medicine

## 2015-12-14 ENCOUNTER — Telehealth: Payer: Self-pay

## 2015-12-14 DIAGNOSIS — I739 Peripheral vascular disease, unspecified: Principal | ICD-10-CM

## 2015-12-14 DIAGNOSIS — I779 Disorder of arteries and arterioles, unspecified: Secondary | ICD-10-CM

## 2015-12-14 NOTE — Telephone Encounter (Signed)
Patient states she should of had a referral put in to have a mri on her leg. I dont see anything about it. Can you follow up. Thank you.

## 2015-12-14 NOTE — Telephone Encounter (Signed)
Please advise 

## 2015-12-14 NOTE — Telephone Encounter (Signed)
I think she means the ABI of her legs to evaluate for peripheral arterial disease - it was ordered - under CV procedures.

## 2015-12-15 NOTE — Addendum Note (Signed)
Addended by: Terence Lux B on: 12/15/2015 10:31 AM   Modules accepted: Orders

## 2015-12-15 NOTE — Telephone Encounter (Signed)
Spoke with pt to inform. Inquired with [facility], orders have been re-entered.

## 2015-12-23 ENCOUNTER — Other Ambulatory Visit: Payer: Self-pay | Admitting: Internal Medicine

## 2015-12-23 DIAGNOSIS — I739 Peripheral vascular disease, unspecified: Secondary | ICD-10-CM

## 2015-12-28 ENCOUNTER — Ambulatory Visit (HOSPITAL_COMMUNITY)
Admission: RE | Admit: 2015-12-28 | Discharge: 2015-12-28 | Disposition: A | Discharge status: Home / Self Care | Payer: Medicare Other | Source: Ambulatory Visit | Attending: Internal Medicine | Admitting: Internal Medicine

## 2015-12-28 ENCOUNTER — Inpatient Hospital Stay (HOSPITAL_COMMUNITY): Admission: RE | Admit: 2015-12-28 | Payer: Medicare Other | Source: Ambulatory Visit

## 2015-12-28 DIAGNOSIS — F419 Anxiety disorder, unspecified: Secondary | ICD-10-CM | POA: Diagnosis not present

## 2015-12-28 DIAGNOSIS — I10 Essential (primary) hypertension: Secondary | ICD-10-CM | POA: Insufficient documentation

## 2015-12-28 DIAGNOSIS — E119 Type 2 diabetes mellitus without complications: Secondary | ICD-10-CM | POA: Diagnosis not present

## 2015-12-28 DIAGNOSIS — E785 Hyperlipidemia, unspecified: Secondary | ICD-10-CM | POA: Insufficient documentation

## 2015-12-28 DIAGNOSIS — I739 Peripheral vascular disease, unspecified: Secondary | ICD-10-CM | POA: Diagnosis not present

## 2016-01-22 ENCOUNTER — Other Ambulatory Visit: Payer: Self-pay | Admitting: Obstetrics and Gynecology

## 2016-01-22 DIAGNOSIS — Z1231 Encounter for screening mammogram for malignant neoplasm of breast: Secondary | ICD-10-CM

## 2016-02-19 ENCOUNTER — Ambulatory Visit
Admission: RE | Admit: 2016-02-19 | Discharge: 2016-02-19 | Disposition: A | Discharge status: Home / Self Care | Payer: Medicare Other | Source: Ambulatory Visit | Attending: Obstetrics and Gynecology | Admitting: Obstetrics and Gynecology

## 2016-02-19 ENCOUNTER — Other Ambulatory Visit: Payer: Self-pay | Admitting: Obstetrics and Gynecology

## 2016-02-19 DIAGNOSIS — Z1231 Encounter for screening mammogram for malignant neoplasm of breast: Secondary | ICD-10-CM

## 2016-03-07 ENCOUNTER — Other Ambulatory Visit: Payer: Self-pay

## 2016-03-16 ENCOUNTER — Telehealth: Payer: Self-pay | Admitting: Emergency Medicine

## 2016-03-16 ENCOUNTER — Other Ambulatory Visit (INDEPENDENT_AMBULATORY_CARE_PROVIDER_SITE_OTHER): Payer: Medicare Other

## 2016-03-16 ENCOUNTER — Encounter: Payer: Self-pay | Admitting: Emergency Medicine

## 2016-03-16 DIAGNOSIS — I779 Disorder of arteries and arterioles, unspecified: Secondary | ICD-10-CM | POA: Diagnosis not present

## 2016-03-16 DIAGNOSIS — Z1389 Encounter for screening for other disorder: Secondary | ICD-10-CM | POA: Diagnosis not present

## 2016-03-16 DIAGNOSIS — I739 Peripheral vascular disease, unspecified: Secondary | ICD-10-CM

## 2016-03-16 DIAGNOSIS — Z01419 Encounter for gynecological examination (general) (routine) without abnormal findings: Secondary | ICD-10-CM | POA: Diagnosis not present

## 2016-03-16 DIAGNOSIS — R7303 Prediabetes: Secondary | ICD-10-CM

## 2016-03-16 DIAGNOSIS — Z6822 Body mass index (BMI) 22.0-22.9, adult: Secondary | ICD-10-CM | POA: Diagnosis not present

## 2016-03-16 DIAGNOSIS — I10 Essential (primary) hypertension: Secondary | ICD-10-CM | POA: Diagnosis not present

## 2016-03-16 LAB — COMPREHENSIVE METABOLIC PANEL
ALBUMIN: 4.1 g/dL (ref 3.5–5.2)
ALT: 19 U/L (ref 0–35)
AST: 17 U/L (ref 0–37)
Alkaline Phosphatase: 82 U/L (ref 39–117)
BILIRUBIN TOTAL: 0.7 mg/dL (ref 0.2–1.2)
BUN: 14 mg/dL (ref 6–23)
CALCIUM: 9.1 mg/dL (ref 8.4–10.5)
CO2: 33 meq/L — AB (ref 19–32)
Chloride: 105 mEq/L (ref 96–112)
Creatinine, Ser: 0.61 mg/dL (ref 0.40–1.20)
GFR: 100.62 mL/min (ref >60.00)
Glucose, Bld: 107 mg/dL — ABNORMAL HIGH (ref 70–99)
Potassium: 4.2 mEq/L (ref 3.5–5.1)
Sodium: 140 mEq/L (ref 135–145)
Total Protein: 6.8 g/dL (ref 6.0–8.3)

## 2016-03-16 LAB — LIPID PANEL
CHOL/HDL RATIO: 3
CHOLESTEROL: 149 mg/dL (ref 0–200)
HDL: 44.7 mg/dL (ref >39.00)
LDL Cholesterol: 83 mg/dL (ref 0–99)
NonHDL: 104.75
TRIGLYCERIDES: 107 mg/dL (ref 0.0–149.0)
VLDL: 21.4 mg/dL (ref 0.0–40.0)

## 2016-03-16 LAB — HEMOGLOBIN A1C: Hgb A1c MFr Bld: 6.1 % (ref 4.6–6.5)

## 2016-03-16 NOTE — Telephone Encounter (Signed)
Pt requested to have Hgb a1c checked. Labs entered.

## 2016-04-07 DIAGNOSIS — N390 Urinary tract infection, site not specified: Secondary | ICD-10-CM | POA: Diagnosis not present

## 2016-04-07 DIAGNOSIS — R350 Frequency of micturition: Secondary | ICD-10-CM | POA: Diagnosis not present

## 2016-04-07 DIAGNOSIS — Z789 Other specified health status: Secondary | ICD-10-CM | POA: Diagnosis not present

## 2016-04-15 ENCOUNTER — Other Ambulatory Visit: Payer: Medicare Other

## 2016-04-15 ENCOUNTER — Ambulatory Visit (INDEPENDENT_AMBULATORY_CARE_PROVIDER_SITE_OTHER): Payer: Medicare Other | Admitting: Internal Medicine

## 2016-04-15 VITALS — BP 122/58 | HR 87 | Temp 98.6°F | Resp 16 | Wt 138.0 lb

## 2016-04-15 DIAGNOSIS — H2513 Age-related nuclear cataract, bilateral: Secondary | ICD-10-CM | POA: Diagnosis not present

## 2016-04-15 DIAGNOSIS — N83202 Unspecified ovarian cyst, left side: Secondary | ICD-10-CM | POA: Diagnosis not present

## 2016-04-15 DIAGNOSIS — R350 Frequency of micturition: Secondary | ICD-10-CM

## 2016-04-15 DIAGNOSIS — I6350 Cerebral infarction due to unspecified occlusion or stenosis of unspecified cerebral artery: Secondary | ICD-10-CM | POA: Diagnosis not present

## 2016-04-15 DIAGNOSIS — R7303 Prediabetes: Secondary | ICD-10-CM

## 2016-04-15 DIAGNOSIS — H1851 Endothelial corneal dystrophy: Secondary | ICD-10-CM | POA: Diagnosis not present

## 2016-04-15 DIAGNOSIS — Z23 Encounter for immunization: Secondary | ICD-10-CM | POA: Diagnosis not present

## 2016-04-15 DIAGNOSIS — H04123 Dry eye syndrome of bilateral lacrimal glands: Secondary | ICD-10-CM | POA: Diagnosis not present

## 2016-04-15 DIAGNOSIS — M549 Dorsalgia, unspecified: Secondary | ICD-10-CM

## 2016-04-15 DIAGNOSIS — I10 Essential (primary) hypertension: Secondary | ICD-10-CM

## 2016-04-15 DIAGNOSIS — E119 Type 2 diabetes mellitus without complications: Secondary | ICD-10-CM | POA: Diagnosis not present

## 2016-04-15 LAB — POCT URINALYSIS DIPSTICK
BILIRUBIN UA: NEGATIVE
Blood, UA: NEGATIVE
GLUCOSE UA: NEGATIVE
KETONES UA: NEGATIVE
LEUKOCYTES UA: NEGATIVE
Nitrite, UA: NEGATIVE
PH UA: 6
Protein, UA: NEGATIVE
Spec Grav, UA: 1.025
Urobilinogen, UA: 0.2

## 2016-04-15 MED ORDER — METHOCARBAMOL 500 MG PO TABS: 500.0000 mg | ORAL_TABLET | Freq: Three times a day (TID) | ORAL | 0 refills | Status: AC | PRN

## 2016-04-15 NOTE — Progress Notes (Signed)
Subjective:    Patient ID: Kristen Rivas, female    DOB: 1935/01/04, 81 y.o.   MRN: [id number]  HPI She is here for an acute visit.   UTI:  She had a recent UTI that was treated by urgent care because she was out of town.  This was her first UTI in a while.  She was presicbed cipro x 3 days. She finished the antibiotics 9/30.  Her symptoms improved, but she still has frequency and lower abdominal pressure.  She is concerned she still has a UTI.  She was concerned because her BP at urgent care 100/50.    She will be leaving town soon and will be gone 5-6 months - she is going to [place] for the winter months.    Back pain:  She had a T12 fracture and saw ortho, Dr Layne Benton one year ago. She increased her vitamin d in hopes that would help.  She is also taking calcium daily.  If she is very active she has been experiencing mid back pain at the end of the day.  It feels tight in that area and tenses up.  It feels muscular.  She wondered about a muscle relaxer that she could take as needed.  Laying down helps.    Prediabetes:  She is compliant with a low sugar/carbohydrate diet.  She is exercising regularly.   Medications and allergies reviewed with patient and updated if appropriate.  Patient Active Problem List   Diagnosis Date Noted  . Mastoiditis 11/04/2015  . Ataxia 10/15/2015  . Earache on right 10/15/2015  . Carotid artery disease ([place]) 09/29/2015  . Osteopenia 11/15/2013  . Pre-diabetes 09/03/2010  . FATIGUE 04/09/2010  . Hyperlipidemia 09/08/2009  . ANXIETY STATE, UNSPECIFIED 08/10/2009  . Essential hypertension 08/04/2009  . CARDIAC ARRHYTHMIA 08/04/2009  . Cerebral artery occlusion with cerebral infarction ([place]) 08/04/2009  . PALPITATIONS 08/04/2009  . Urinary incontinence 08/04/2009  . Diverticulosis of colon without hemorrhage 08/04/2009    Current Outpatient Prescriptions on File Prior to Visit  Medication Sig Dispense Refill  . amLODipine-olmesartan (AZOR) 5-40 MG  tablet Take 1 tablet by mouth every morning. 90 tablet 3  . ascorbic acid (VITAMIN C) 500 MG tablet Take 500 mg by mouth 2 (two) times daily. Not everyday    . aspirin 81 MG tablet Take 162 mg by mouth daily.    . Calcium Carb-Cholecalciferol (CALCIUM 600+D) 600-800 MG-UNIT TABS Take 1 tablet by mouth 2 (two) times daily. 60 tablet   . cholecalciferol (VITAMIN D) 1000 UNITS tablet Take 1,000 Units by mouth daily.    [name] glucose blood (ACCU-CHEK AVIVA PLUS) test strip Use to check blood sugar once a day. Dx E11.9 100 each 3  . simvastatin (ZOCOR) 10 MG tablet Take 1 tablet (10 mg total) by mouth at bedtime. 90 tablet 3  . [DISCONTINUED] Glucose Blood (ACCU-CHEK AVIVA VI) 1 each as directed.       No current facility-administered medications on file prior to visit.     Past Medical History:  Diagnosis Date  . Anxiety state, unspecified   . CARDIAC ARRHYTHMIA   . CVA 07/2006   right thalamic CVA  . DIABETES MELLITUS, TYPE II   . DYSLIPIDEMIA   . HYPERTENSION   . Palpitations     Past Surgical History:  Procedure Laterality Date  . ABDOMINAL HYSTERECTOMY  1974  . BACK SURGERY  1981   4-5 lumbar inner body fusion  . BREAST ENHANCEMENT SURGERY    .  COLONOSCOPY     tics  . HEMORRHOID SURGERY     x's 2 1961 & 1964  . NASAL SINUS SURGERY  1985   polyps removed  . WISDOM TOOTH EXTRACTION      Social History   Social History  . Marital status: Divorced    Spouse name: N/A  . Number of children: N/A  . Years of education: N/A   Social History Main Topics  . Smoking status: Never Smoker  . Smokeless tobacco: Never Used     Comment: Divorced, lives alone. Retired  . Alcohol use No  . Drug use: No  . Sexual activity: No   Other Topics Concern  . Not on file   Social History Narrative  . No narrative on file    Family History  Problem Relation Age of Onset  . Arthritis Mother   . Lung cancer Sister     non smoker  . Colon cancer Sister 32    died at 13  . Heart  disease Brother   . Lung cancer Brother     X3, all smokers  . Diabetes Other     diabetes  . Arthritis Other   . Stroke Other     parent &  M grandfather  . Aneurysm Brother     Review of Systems  Constitutional: Negative for chills and fever.  Respiratory: Negative for shortness of breath.   Cardiovascular: Negative for chest pain, palpitations and leg swelling.  Gastrointestinal: Positive for abdominal pain (lower abdominal pressure).  Genitourinary: Positive for frequency. Negative for dysuria.  Musculoskeletal: Positive for back pain and myalgias.  Neurological: Negative for weakness, light-headedness, numbness and headaches.       Objective:   Vitals:   04/15/16 1604  BP: (!) 122/58  Pulse: 87  Resp: 16  Temp: 98.6 F (37 C)   Filed Weights   04/15/16 1604  Weight: 138 lb (62.6 kg)   Body mass index is 22.27 kg/m.   Physical Exam Constitutional: Appears well-developed and well-nourished. No distress.  HENT:  Head: Normocephalic and atraumatic.  Neck: Neck supple. No tracheal deviation present. No thyromegaly present.  No cervical lymphadenopathy Cardiovascular: Normal rate, regular rhythm and normal heart sounds.   No murmur heard. No carotid bruit .  No edema Pulmonary/Chest: Effort normal and breath sounds normal. No respiratory distress. No has no wheezes. No rales.  Msk:  Mild pain in mid thoracic spine along vertebrae and paravertebral spine Skin: Skin is warm and dry. Not diaphoretic.  Psychiatric: Normal mood and affect. Behavior is normal.       Assessment & Plan:   See Problem List for Assessment and Plan of chronic medical problems.

## 2016-04-15 NOTE — Assessment & Plan Note (Signed)
Last a1c 6.1  Has annual eye exam

## 2016-04-15 NOTE — Progress Notes (Signed)
Pre visit review using our clinic review tool, if applicable. No additional management support is needed unless otherwise documented below in the visit note. 

## 2016-04-15 NOTE — Patient Instructions (Signed)
   Flu vaccine administered today.   Medications reviewed and updated.  Changes include trying methocarbomal for you back pain.    Your prescription(s) have been submitted to your pharmacy. Please take as directed and contact our office if you believe you are having problem(s) with the medication(s).

## 2016-04-16 ENCOUNTER — Telehealth: Payer: Self-pay

## 2016-04-16 NOTE — Telephone Encounter (Signed)
PA initiated via CoverMyMeds key [id number]

## 2016-04-17 ENCOUNTER — Encounter: Payer: Self-pay | Admitting: Internal Medicine

## 2016-04-17 DIAGNOSIS — M549 Dorsalgia, unspecified: Secondary | ICD-10-CM | POA: Insufficient documentation

## 2016-04-17 DIAGNOSIS — R350 Frequency of micturition: Secondary | ICD-10-CM | POA: Insufficient documentation

## 2016-04-17 LAB — URINE CULTURE: ORGANISM ID, BACTERIA: NO GROWTH

## 2016-04-17 NOTE — Assessment & Plan Note (Signed)
BP low at urgent care, on low side today Advised her of BP goal < 150/90 - she will start monitoring at home We will continue her current medications at current doses, but may be able to lower her medication in the near future depending on her BP

## 2016-04-17 NOTE — Assessment & Plan Note (Signed)
Urine dip here looks benign Will culture No antibiotic now - will await culture results

## 2016-04-18 NOTE — Telephone Encounter (Signed)
PA APPROVED for 12 months through 04/16/2017

## 2016-06-29 DIAGNOSIS — J40 Bronchitis, not specified as acute or chronic: Secondary | ICD-10-CM | POA: Diagnosis not present

## 2016-10-06 DIAGNOSIS — H00025 Hordeolum internum left lower eyelid: Secondary | ICD-10-CM | POA: Diagnosis not present

## 2016-10-26 DIAGNOSIS — H00025 Hordeolum internum left lower eyelid: Secondary | ICD-10-CM | POA: Diagnosis not present

## 2016-11-14 ENCOUNTER — Other Ambulatory Visit: Payer: Self-pay | Admitting: Internal Medicine

## 2016-11-21 DIAGNOSIS — R9089 Other abnormal findings on diagnostic imaging of central nervous system: Secondary | ICD-10-CM | POA: Diagnosis not present

## 2016-11-21 DIAGNOSIS — I10 Essential (primary) hypertension: Secondary | ICD-10-CM | POA: Diagnosis not present

## 2016-11-21 DIAGNOSIS — Z79899 Other long term (current) drug therapy: Secondary | ICD-10-CM | POA: Diagnosis not present

## 2016-11-21 DIAGNOSIS — Z7982 Long term (current) use of aspirin: Secondary | ICD-10-CM | POA: Diagnosis not present

## 2016-11-21 DIAGNOSIS — R531 Weakness: Secondary | ICD-10-CM | POA: Diagnosis not present

## 2016-11-21 DIAGNOSIS — Z880 Allergy status to penicillin: Secondary | ICD-10-CM | POA: Diagnosis not present

## 2016-11-21 DIAGNOSIS — Z881 Allergy status to other antibiotic agents status: Secondary | ICD-10-CM | POA: Diagnosis not present

## 2016-11-21 DIAGNOSIS — Z8673 Personal history of transient ischemic attack (TIA), and cerebral infarction without residual deficits: Secondary | ICD-10-CM | POA: Diagnosis not present

## 2016-11-21 DIAGNOSIS — E785 Hyperlipidemia, unspecified: Secondary | ICD-10-CM | POA: Diagnosis not present

## 2016-11-21 DIAGNOSIS — R600 Localized edema: Secondary | ICD-10-CM | POA: Diagnosis not present

## 2016-11-21 DIAGNOSIS — G3189 Other specified degenerative diseases of nervous system: Secondary | ICD-10-CM | POA: Diagnosis not present

## 2016-11-21 DIAGNOSIS — R9082 White matter disease, unspecified: Secondary | ICD-10-CM | POA: Diagnosis not present

## 2016-11-21 DIAGNOSIS — I6789 Other cerebrovascular disease: Secondary | ICD-10-CM | POA: Diagnosis not present

## 2016-11-21 DIAGNOSIS — M79662 Pain in left lower leg: Secondary | ICD-10-CM | POA: Diagnosis not present

## 2016-11-21 DIAGNOSIS — I517 Cardiomegaly: Secondary | ICD-10-CM | POA: Diagnosis not present

## 2016-11-21 DIAGNOSIS — G459 Transient cerebral ischemic attack, unspecified: Secondary | ICD-10-CM | POA: Diagnosis not present

## 2016-11-21 DIAGNOSIS — I6521 Occlusion and stenosis of right carotid artery: Secondary | ICD-10-CM | POA: Diagnosis not present

## 2016-11-21 DIAGNOSIS — R29898 Other symptoms and signs involving the musculoskeletal system: Secondary | ICD-10-CM | POA: Diagnosis not present

## 2016-11-21 DIAGNOSIS — R42 Dizziness and giddiness: Secondary | ICD-10-CM | POA: Diagnosis not present

## 2016-11-22 DIAGNOSIS — R531 Weakness: Secondary | ICD-10-CM | POA: Diagnosis not present

## 2016-11-22 DIAGNOSIS — M79605 Pain in left leg: Secondary | ICD-10-CM | POA: Diagnosis not present

## 2016-11-22 DIAGNOSIS — M7989 Other specified soft tissue disorders: Secondary | ICD-10-CM | POA: Diagnosis not present

## 2016-11-22 DIAGNOSIS — R42 Dizziness and giddiness: Secondary | ICD-10-CM | POA: Diagnosis not present

## 2016-11-22 DIAGNOSIS — I6523 Occlusion and stenosis of bilateral carotid arteries: Secondary | ICD-10-CM | POA: Diagnosis not present

## 2017-01-11 ENCOUNTER — Other Ambulatory Visit: Payer: Self-pay | Admitting: Internal Medicine

## 2017-03-09 DIAGNOSIS — S22080D Wedge compression fracture of T11-T12 vertebra, subsequent encounter for fracture with routine healing: Secondary | ICD-10-CM | POA: Diagnosis not present

## 2017-03-09 DIAGNOSIS — Z6821 Body mass index (BMI) 21.0-21.9, adult: Secondary | ICD-10-CM | POA: Diagnosis not present

## 2017-03-09 DIAGNOSIS — N83202 Unspecified ovarian cyst, left side: Secondary | ICD-10-CM | POA: Diagnosis not present

## 2017-03-09 DIAGNOSIS — G459 Transient cerebral ischemic attack, unspecified: Secondary | ICD-10-CM | POA: Diagnosis not present

## 2017-03-10 ENCOUNTER — Telehealth: Payer: Self-pay | Admitting: Internal Medicine

## 2017-03-10 NOTE — Telephone Encounter (Signed)
Pt called in and is moving to [place] and has some questions and ask for you to call her when you get a chance?

## 2017-03-14 NOTE — Telephone Encounter (Signed)
Spoke with pt to give immunization dates, Carotid US results and DG spine results for new Doctor in [facility]

## 2017-03-20 DIAGNOSIS — I6523 Occlusion and stenosis of bilateral carotid arteries: Secondary | ICD-10-CM | POA: Diagnosis not present

## 2017-03-20 DIAGNOSIS — Z90721 Acquired absence of ovaries, unilateral: Secondary | ICD-10-CM | POA: Diagnosis not present

## 2017-03-20 DIAGNOSIS — Z90710 Acquired absence of both cervix and uterus: Secondary | ICD-10-CM | POA: Diagnosis not present

## 2017-03-20 DIAGNOSIS — N83202 Unspecified ovarian cyst, left side: Secondary | ICD-10-CM | POA: Diagnosis not present

## 2017-04-11 DIAGNOSIS — Z23 Encounter for immunization: Secondary | ICD-10-CM | POA: Diagnosis not present

## 2017-06-05 DIAGNOSIS — E7800 Pure hypercholesterolemia, unspecified: Secondary | ICD-10-CM | POA: Diagnosis not present

## 2017-06-05 DIAGNOSIS — N83202 Unspecified ovarian cyst, left side: Secondary | ICD-10-CM | POA: Diagnosis not present

## 2017-06-05 DIAGNOSIS — Z1231 Encounter for screening mammogram for malignant neoplasm of breast: Secondary | ICD-10-CM | POA: Diagnosis not present

## 2017-06-12 DIAGNOSIS — Z6821 Body mass index (BMI) 21.0-21.9, adult: Secondary | ICD-10-CM | POA: Diagnosis not present

## 2017-06-12 DIAGNOSIS — I6523 Occlusion and stenosis of bilateral carotid arteries: Secondary | ICD-10-CM | POA: Diagnosis not present

## 2017-06-12 DIAGNOSIS — E7800 Pure hypercholesterolemia, unspecified: Secondary | ICD-10-CM | POA: Diagnosis not present

## 2017-06-12 DIAGNOSIS — R7301 Impaired fasting glucose: Secondary | ICD-10-CM | POA: Diagnosis not present

## 2017-06-14 DIAGNOSIS — H47092 Other disorders of optic nerve, not elsewhere classified, left eye: Secondary | ICD-10-CM | POA: Diagnosis not present

## 2017-06-14 DIAGNOSIS — H5203 Hypermetropia, bilateral: Secondary | ICD-10-CM | POA: Diagnosis not present

## 2017-06-14 DIAGNOSIS — H524 Presbyopia: Secondary | ICD-10-CM | POA: Diagnosis not present

## 2017-06-14 DIAGNOSIS — H52223 Regular astigmatism, bilateral: Secondary | ICD-10-CM | POA: Diagnosis not present

## 2017-06-14 DIAGNOSIS — E119 Type 2 diabetes mellitus without complications: Secondary | ICD-10-CM | POA: Diagnosis not present

## 2017-06-14 DIAGNOSIS — H5340 Unspecified visual field defects: Secondary | ICD-10-CM | POA: Diagnosis not present

## 2017-06-14 DIAGNOSIS — H53483 Generalized contraction of visual field, bilateral: Secondary | ICD-10-CM | POA: Diagnosis not present

## 2017-06-14 DIAGNOSIS — D3141 Benign neoplasm of right ciliary body: Secondary | ICD-10-CM | POA: Diagnosis not present

## 2017-06-15 DIAGNOSIS — N3000 Acute cystitis without hematuria: Secondary | ICD-10-CM | POA: Diagnosis not present

## 2017-06-15 DIAGNOSIS — R300 Dysuria: Secondary | ICD-10-CM | POA: Diagnosis not present

## 2017-06-15 DIAGNOSIS — Z6822 Body mass index (BMI) 22.0-22.9, adult: Secondary | ICD-10-CM | POA: Diagnosis not present

## 2017-06-16 DIAGNOSIS — E119 Type 2 diabetes mellitus without complications: Secondary | ICD-10-CM | POA: Diagnosis not present

## 2017-06-16 DIAGNOSIS — H53483 Generalized contraction of visual field, bilateral: Secondary | ICD-10-CM | POA: Diagnosis not present

## 2017-06-16 DIAGNOSIS — H47092 Other disorders of optic nerve, not elsewhere classified, left eye: Secondary | ICD-10-CM | POA: Diagnosis not present

## 2017-07-25 DIAGNOSIS — H53483 Generalized contraction of visual field, bilateral: Secondary | ICD-10-CM | POA: Diagnosis not present

## 2017-07-25 DIAGNOSIS — H47092 Other disorders of optic nerve, not elsewhere classified, left eye: Secondary | ICD-10-CM | POA: Diagnosis not present

## 2017-08-10 IMAGING — MR MR LUMBAR SPINE W/O CM
4 of 5 series · 24 of 48 positions shown · non-contrast
Comparison: CT scan 06/09/2014

CLINICAL DATA: Injured back and early [REDACTED]. Persistent back
pain and left hip and buttock pain. A T12 fracture was seen on
x-rays. Remote history of lumbar surgery.

EXAM:
MRI LUMBAR SPINE WITHOUT CONTRAST
TECHNIQUE: Multiplanar, multisequence MR imaging of the lumbar spine was
performed. No intravenous contrast was administered.

[Series 3: T2 · sagittal · 4.0mm · 0.44mm/px · 6 of 12 slices shown (1 of 2)]
[im 1/12]
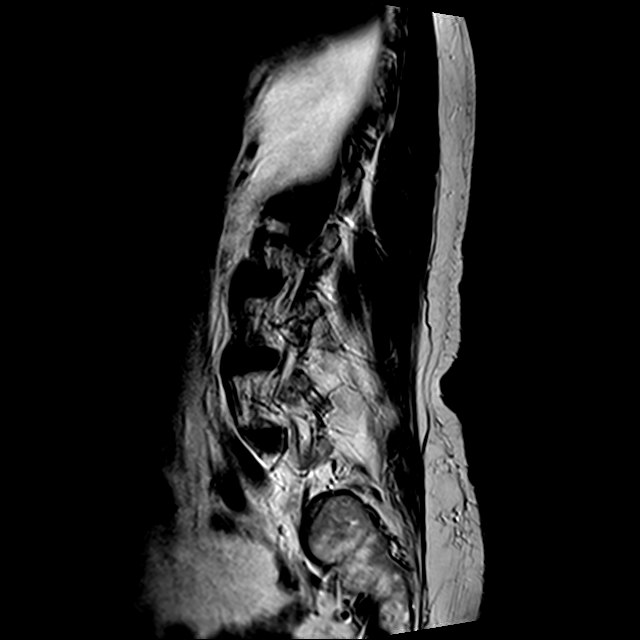
[im 3/12]
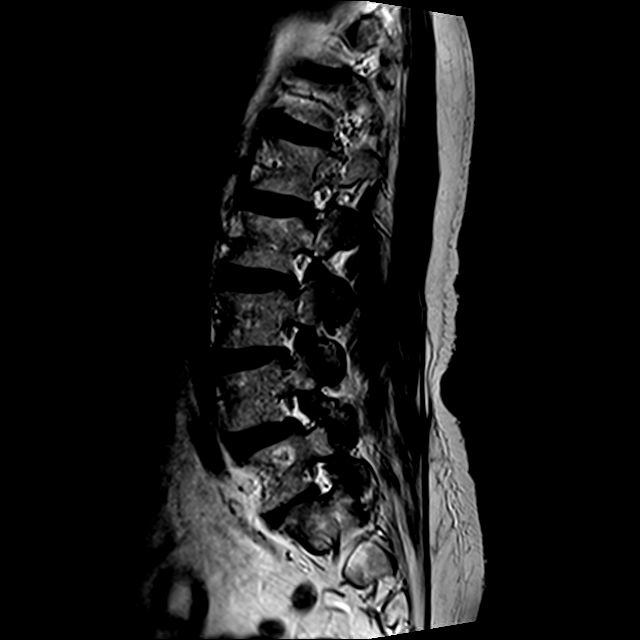
[im 5/12]
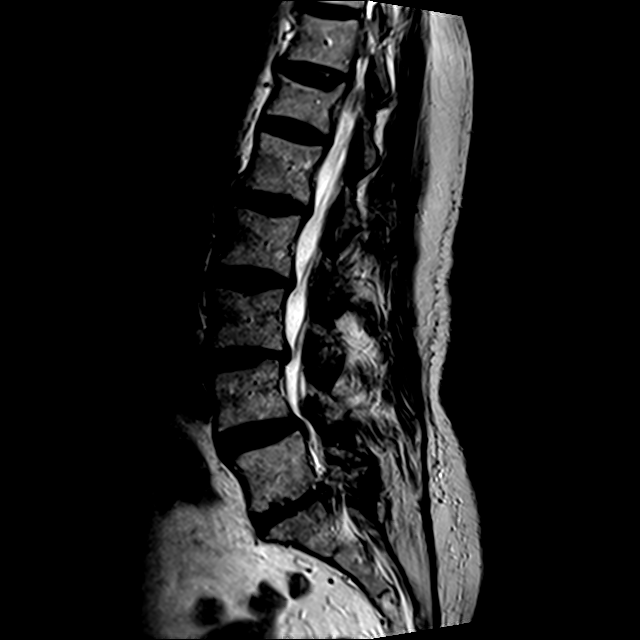
[im 7/12]
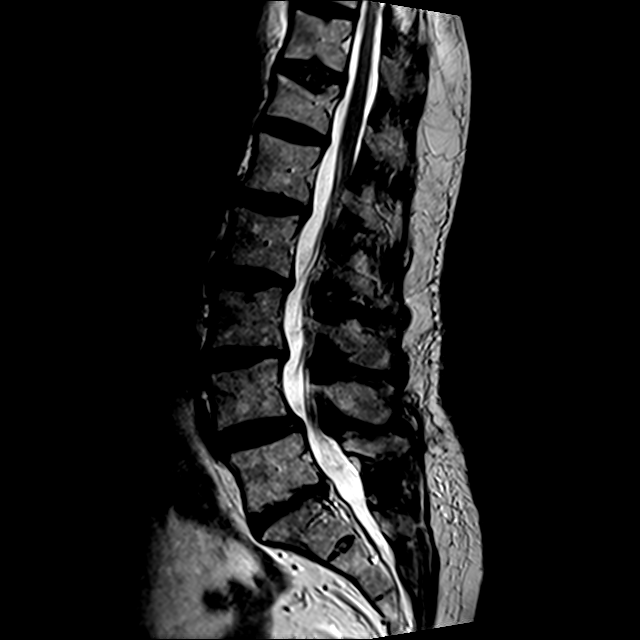
[im 9/12]
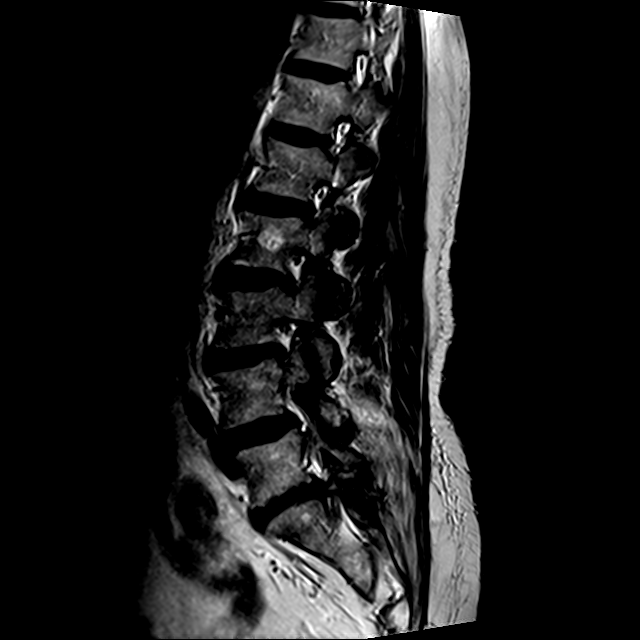
[im 12/12]
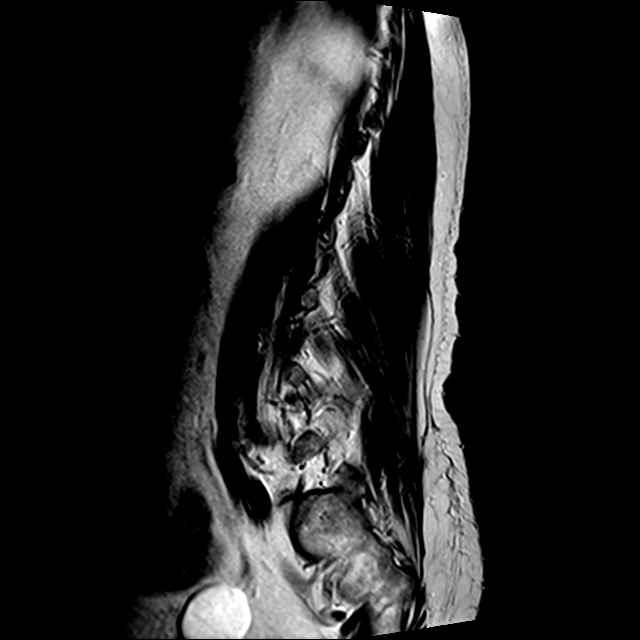

[Series 5: T1 · axial · 4.0mm · 0.37mm/px · z∈[-68,+147]mm · 5 of 36 slices shown (1 of 2)]
[im 3/36]
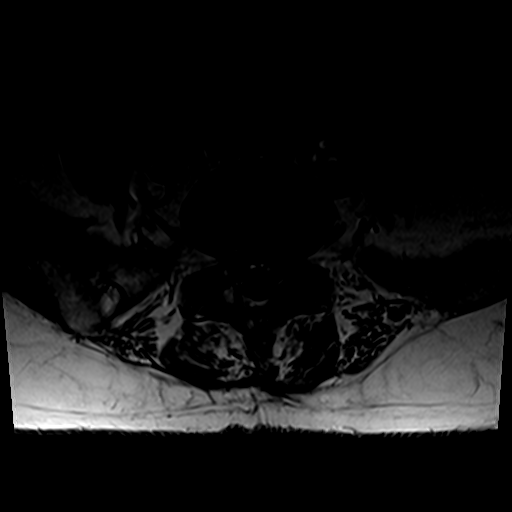
[im 5/36]
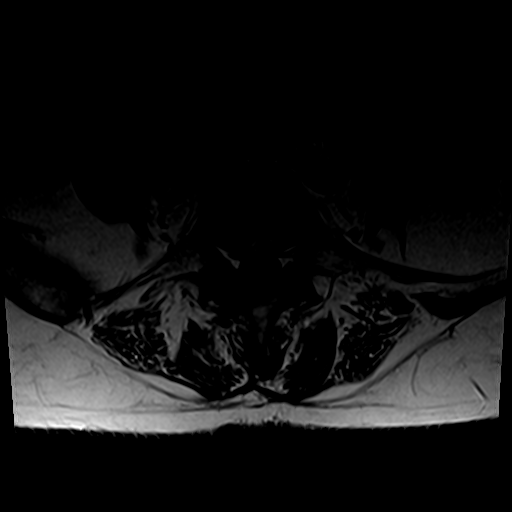
[im 8/36]
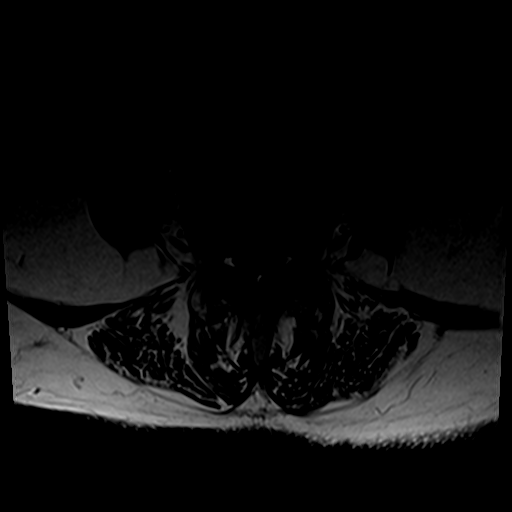
[im 19/36]
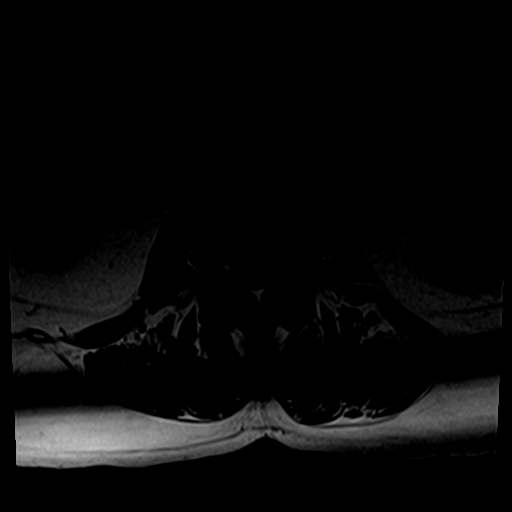
[im 31/36]
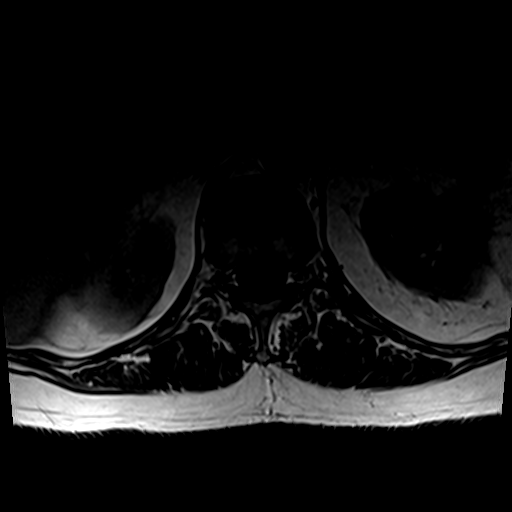

[Series 7: T2 · axial · 4.0mm · 0.74mm/px · z∈[-68,+172]mm · 10 of 36 slices shown (2 of 2)]
[im 3/36]
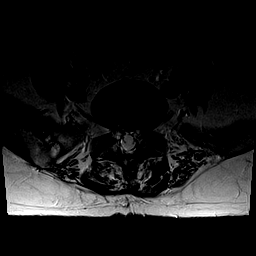
[im 5/36]
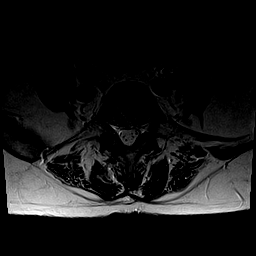
[im 8/36]
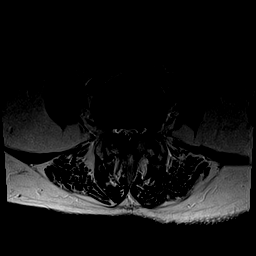
[im 12/36]
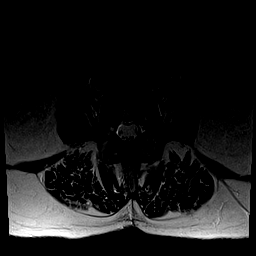
[im 17/36]
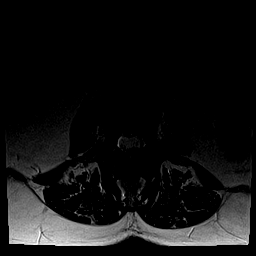
[im 19/36]
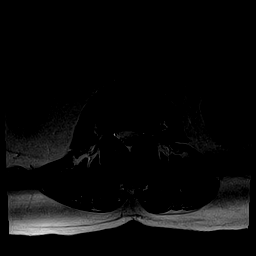
[im 22/36]
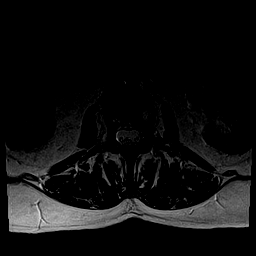
[im 26/36]
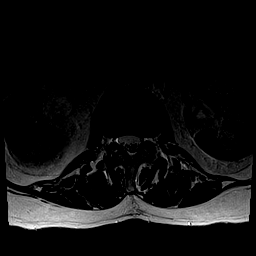
[im 31/36]
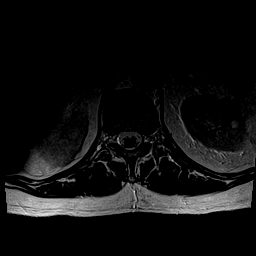
[im 36/36]
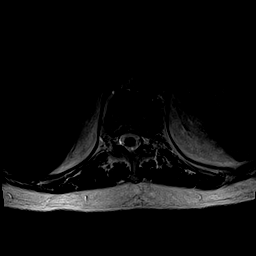

[Series 8: T1 · sagittal · 4.0mm · 0.88mm/px · 3 of 12 slices shown (2 of 2)]
[im 1/12]
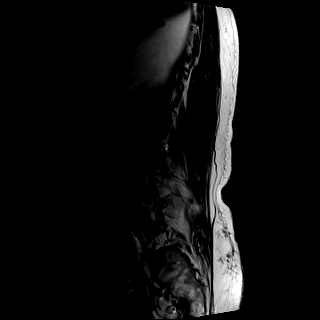
[im 6/12]
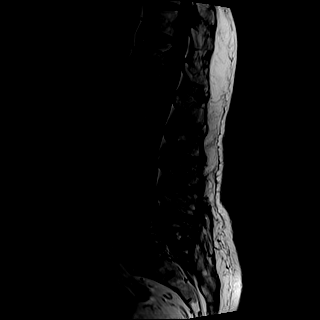
[im 12/12]
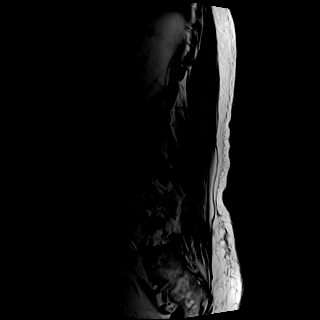

[24 of 48 positions shown; findings below may reference images not displayed]

FINDINGS: Normal and stable overall alignment of the lumbar vertebral bodies.
They demonstrate normal marrow signal except for mild endplate
reactive changes and few small scattered hemangiomas. The T12
compression deformity is remote. No acute compression fracture. The
last full intervertebral disc space is labeled L5-S1 and the conus
medullaris terminates at L1. Advanced facet disease but no definite
pars defects.

No significant paraspinal or retroperitoneal findings. A 3 cm left
adnexal cyst is noted. This is unchanged since a CT scan from 6197.

T11-12:  No retropulsion or canal compromise.

T12-L1:  No significant findings.

L1-2: Mild annular bulge but no significant spinal or foraminal
stenosis.

L2-3: Mild hypertrophic facet disease but no disc protrusions,
spinal or foraminal stenosis.

L3-4: Mild degenerative disc disease with mild osteophytic ridging
posteriorly but no focal disc protrusion, spinal or foraminal
stenosis. Moderate facet disease.

L4-5: Mild degenerative anterolisthesis of L4 with a bulging
uncovered disc. This in combination with short pedicles and advanced
facet disease contributes to moderate to moderately severe spinal
and bilateral lateral recess stenosis. No foraminal stenosis.

L5-S1: Remote surgical changes. Moderate facet disease. No spinal or
foraminal stenosis.
IMPRESSION: 1. Moderate to moderately severe spinal and bilateral lateral recess
stenosis at L4-5.
2. Postoperative changes at L5-S1 but no disc protrusion, spinal or
foraminal stenosis.
3. Remote T12 compression fracture.  No new/acute findings.

## 2017-09-04 DIAGNOSIS — Z1231 Encounter for screening mammogram for malignant neoplasm of breast: Secondary | ICD-10-CM | POA: Diagnosis not present

## 2017-10-19 DIAGNOSIS — S4991XA Unspecified injury of right shoulder and upper arm, initial encounter: Secondary | ICD-10-CM | POA: Diagnosis not present

## 2017-10-19 DIAGNOSIS — Z881 Allergy status to other antibiotic agents status: Secondary | ICD-10-CM | POA: Diagnosis not present

## 2017-10-19 DIAGNOSIS — M1990 Unspecified osteoarthritis, unspecified site: Secondary | ICD-10-CM | POA: Diagnosis not present

## 2017-10-19 DIAGNOSIS — Z880 Allergy status to penicillin: Secondary | ICD-10-CM | POA: Diagnosis not present

## 2017-10-19 DIAGNOSIS — Z7982 Long term (current) use of aspirin: Secondary | ICD-10-CM | POA: Diagnosis not present

## 2017-10-19 DIAGNOSIS — Z79899 Other long term (current) drug therapy: Secondary | ICD-10-CM | POA: Diagnosis not present

## 2017-10-19 DIAGNOSIS — I10 Essential (primary) hypertension: Secondary | ICD-10-CM | POA: Diagnosis not present

## 2017-10-19 DIAGNOSIS — E7800 Pure hypercholesterolemia, unspecified: Secondary | ICD-10-CM | POA: Diagnosis not present

## 2017-10-19 DIAGNOSIS — Z8673 Personal history of transient ischemic attack (TIA), and cerebral infarction without residual deficits: Secondary | ICD-10-CM | POA: Diagnosis not present

## 2017-10-19 DIAGNOSIS — M25511 Pain in right shoulder: Secondary | ICD-10-CM | POA: Diagnosis not present

## 2017-10-30 DIAGNOSIS — I10 Essential (primary) hypertension: Secondary | ICD-10-CM | POA: Diagnosis not present

## 2017-10-30 DIAGNOSIS — Z6822 Body mass index (BMI) 22.0-22.9, adult: Secondary | ICD-10-CM | POA: Diagnosis not present

## 2017-11-28 DIAGNOSIS — R7301 Impaired fasting glucose: Secondary | ICD-10-CM | POA: Diagnosis not present

## 2017-11-28 DIAGNOSIS — E7800 Pure hypercholesterolemia, unspecified: Secondary | ICD-10-CM | POA: Diagnosis not present

## 2017-11-30 DIAGNOSIS — L298 Other pruritus: Secondary | ICD-10-CM | POA: Diagnosis not present

## 2017-11-30 DIAGNOSIS — L570 Actinic keratosis: Secondary | ICD-10-CM | POA: Diagnosis not present

## 2017-11-30 DIAGNOSIS — L820 Inflamed seborrheic keratosis: Secondary | ICD-10-CM | POA: Diagnosis not present

## 2017-11-30 DIAGNOSIS — L538 Other specified erythematous conditions: Secondary | ICD-10-CM | POA: Diagnosis not present

## 2017-11-30 DIAGNOSIS — L853 Xerosis cutis: Secondary | ICD-10-CM | POA: Diagnosis not present

## 2017-11-30 DIAGNOSIS — L821 Other seborrheic keratosis: Secondary | ICD-10-CM | POA: Diagnosis not present

## 2017-12-12 DIAGNOSIS — M25552 Pain in left hip: Secondary | ICD-10-CM | POA: Diagnosis not present

## 2017-12-12 DIAGNOSIS — Z1389 Encounter for screening for other disorder: Secondary | ICD-10-CM | POA: Diagnosis not present

## 2017-12-12 DIAGNOSIS — Z6822 Body mass index (BMI) 22.0-22.9, adult: Secondary | ICD-10-CM | POA: Diagnosis not present

## 2017-12-12 DIAGNOSIS — Z789 Other specified health status: Secondary | ICD-10-CM | POA: Diagnosis not present

## 2017-12-12 DIAGNOSIS — I10 Essential (primary) hypertension: Secondary | ICD-10-CM | POA: Diagnosis not present

## 2017-12-12 DIAGNOSIS — Z0000 Encounter for general adult medical examination without abnormal findings: Secondary | ICD-10-CM | POA: Diagnosis not present

## 2017-12-12 DIAGNOSIS — R7301 Impaired fasting glucose: Secondary | ICD-10-CM | POA: Diagnosis not present

## 2017-12-12 DIAGNOSIS — E7800 Pure hypercholesterolemia, unspecified: Secondary | ICD-10-CM | POA: Diagnosis not present

## 2017-12-12 DIAGNOSIS — N958 Other specified menopausal and perimenopausal disorders: Secondary | ICD-10-CM | POA: Diagnosis not present
